# Patient Record
Sex: Female | Born: 2000
Health system: Southern US, Community
[De-identification: ages and names within clinical notes are randomized; demographics above are authoritative.]

## PROBLEM LIST (undated history)

## (undated) ENCOUNTER — Emergency Department (HOSPITAL_COMMUNITY): Admission: EM | Payer: Medicaid Other | Source: Home / Self Care

## (undated) DIAGNOSIS — F419 Anxiety disorder, unspecified: Secondary | ICD-10-CM

## (undated) DIAGNOSIS — F32A Depression, unspecified: Secondary | ICD-10-CM

## (undated) HISTORY — PX: NO PAST SURGERIES: SHX2092

## (undated) HISTORY — DX: Anxiety disorder, unspecified: F41.9

## (undated) HISTORY — DX: Depression, unspecified: F32.A

---
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
## 2022-01-27 DIAGNOSIS — Z419 Encounter for procedure for purposes other than remedying health state, unspecified: Secondary | ICD-10-CM | POA: Diagnosis not present

## 2022-02-27 DIAGNOSIS — Z419 Encounter for procedure for purposes other than remedying health state, unspecified: Secondary | ICD-10-CM | POA: Diagnosis not present

## 2022-03-29 DIAGNOSIS — Z419 Encounter for procedure for purposes other than remedying health state, unspecified: Secondary | ICD-10-CM | POA: Diagnosis not present

## 2022-04-29 DIAGNOSIS — Z419 Encounter for procedure for purposes other than remedying health state, unspecified: Secondary | ICD-10-CM | POA: Diagnosis not present

## 2022-05-01 ENCOUNTER — Ambulatory Visit
Admission: EM | Admit: 2022-05-01 | Discharge: 2022-05-01 | Disposition: A | Discharge status: Home / Self Care | Payer: Medicaid Other | Attending: Physician Assistant | Admitting: Physician Assistant

## 2022-05-01 ENCOUNTER — Emergency Department (HOSPITAL_COMMUNITY)
Admission: EM | Admit: 2022-05-01 | Discharge: 2022-05-02 | Disposition: A | Discharge status: Home / Self Care | Payer: Medicaid Other | Attending: Emergency Medicine | Admitting: Emergency Medicine

## 2022-05-01 ENCOUNTER — Other Ambulatory Visit: Payer: Self-pay

## 2022-05-01 DIAGNOSIS — I959 Hypotension, unspecified: Secondary | ICD-10-CM | POA: Diagnosis not present

## 2022-05-01 DIAGNOSIS — R109 Unspecified abdominal pain: Secondary | ICD-10-CM

## 2022-05-01 DIAGNOSIS — R1084 Generalized abdominal pain: Secondary | ICD-10-CM | POA: Diagnosis not present

## 2022-05-01 DIAGNOSIS — R112 Nausea with vomiting, unspecified: Secondary | ICD-10-CM

## 2022-05-01 DIAGNOSIS — R110 Nausea: Secondary | ICD-10-CM | POA: Diagnosis not present

## 2022-05-01 DIAGNOSIS — Z1152 Encounter for screening for COVID-19: Secondary | ICD-10-CM | POA: Insufficient documentation

## 2022-05-01 DIAGNOSIS — Z743 Need for continuous supervision: Secondary | ICD-10-CM | POA: Diagnosis not present

## 2022-05-01 LAB — URINALYSIS, ROUTINE W REFLEX MICROSCOPIC
Bilirubin Urine: NEGATIVE
Glucose, UA: NEGATIVE mg/dL
Ketones, ur: 5 mg/dL — AB
Leukocytes,Ua: NEGATIVE
Nitrite: NEGATIVE
Protein, ur: 100 mg/dL — AB
Specific Gravity, Urine: 1.021 (ref 1.005–1.030)
pH: 7 (ref 5.0–8.0)

## 2022-05-01 LAB — CBC
HCT: 38.7 % (ref 36.0–46.0)
Hemoglobin: 13.1 g/dL (ref 12.0–15.0)
MCH: 31.5 pg (ref 26.0–34.0)
MCHC: 33.9 g/dL (ref 30.0–36.0)
MCV: 93 fL (ref 80.0–100.0)
Platelets: 212 10*3/uL (ref 150–400)
RBC: 4.16 MIL/uL (ref 3.87–5.11)
RDW: 12.2 % (ref 11.5–15.5)
WBC: 9 10*3/uL (ref 4.0–10.5)
nRBC: 0 % (ref 0.0–0.2)

## 2022-05-01 LAB — COMPREHENSIVE METABOLIC PANEL
ALT: 11 U/L (ref 0–44)
AST: 23 U/L (ref 15–41)
Albumin: 4.7 g/dL (ref 3.5–5.0)
Alkaline Phosphatase: 60 U/L (ref 38–126)
Anion gap: 11 (ref 5–15)
BUN: 8 mg/dL (ref 6–20)
CO2: 22 mmol/L (ref 22–32)
Calcium: 9.5 mg/dL (ref 8.9–10.3)
Chloride: 105 mmol/L (ref 98–111)
Creatinine, Ser: 0.71 mg/dL (ref 0.44–1.00)
GFR, Estimated: >60 mL/min (ref >60)
Glucose, Bld: 123 mg/dL — ABNORMAL HIGH (ref 70–99)
Potassium: 3.5 mmol/L (ref 3.5–5.1)
Sodium: 138 mmol/L (ref 135–145)
Total Bilirubin: 0.8 mg/dL (ref 0.3–1.2)
Total Protein: 7.6 g/dL (ref 6.5–8.1)

## 2022-05-01 LAB — I-STAT BETA HCG BLOOD, ED (MC, WL, AP ONLY): I-stat hCG, quantitative: <5 m[IU]/mL (ref <5)

## 2022-05-01 LAB — LIPASE, BLOOD: Lipase: 30 U/L (ref 11–51)

## 2022-05-01 MED ORDER — PROMETHAZINE HCL 25 MG/ML IJ SOLN
12.5000 mg | Freq: Four times a day (QID) | INTRAMUSCULAR | Status: DC | PRN
  Filled 2022-05-01: qty 1

## 2022-05-01 MED ORDER — ONDANSETRON HCL 4 MG/2ML IJ SOLN: 4.0000 mg | Freq: Once | INTRAMUSCULAR | Status: DC

## 2022-05-01 NOTE — ED Triage Notes (Signed)
Pt arrived via EMS, from UC, c/o n/v abd pain

## 2022-05-01 NOTE — ED Triage Notes (Signed)
Pt c/o severe abd pain, nausea, vomiting, diarrhea, onset ~ last night.

## 2022-05-01 NOTE — ED Notes (Signed)
Patient is being discharged from the Urgent Care and sent to the Emergency Department via EMS . Per Shanon Brow, patient is in need of higher level of care due to not interactive in exam, vomiting. Patient is aware and verbalizes understanding of plan of care.  Vitals:   05/01/22 1222  BP: 110/63  Pulse: (!) 50  Resp: 18  Temp: (!) 97.3 F (36.3 C)  SpO2: 100%

## 2022-05-01 NOTE — Discharge Instructions (Addendum)
Advised transport to ER by EMS for higher level of care.

## 2022-05-01 NOTE — ED Provider Notes (Signed)
[facility]    CSN: [id number] Arrival date & time: 05/01/22  1153      History   Chief Complaint Chief Complaint  Patient presents with   Abdominal Pain    HPI Ashley Macias is a 22 y.o. female.   22 year old female presents with abdominal pain, nausea and vomiting.  Patient is actively throwing up in the exam room and is difficult to communicate with due to the amount of abdominal discomfort she is having and repeated vomiting.  Unable to give 4 mg of Zofran IM to control the abdominal pain and vomiting due to the severity of the patients symptoms.  It is difficult to evaluate the patient due to her not being able to follow the directions and it is difficult to get her to communicate due to her current level of discomfort.  Patient indicates that earlier this morning she started having abdominal pain, cramping, vomiting frequently with associated diarrhea and watery stools.  Indicates that the cramping is pain is so bad that she is curled up in the fetal position.  Patient denies having any fever, chills.  Patient indicates that before the symptoms started she ate cheese type products from [employer].  Patient denies being around any family or friends with similar symptoms. Patient indicates that her last period was at the beginning to the middle of December.  Patient is currently not on birth control, she is sexually active, and is not using protection.  Patient denies any urinary symptoms, fever or chills.   Abdominal Pain   Past Medical History:  Diagnosis Date   Anxiety    [employer] 12/27/2019    There are no problems to display for this patient.   History reviewed. No pertinent surgical history.  OB History     Gravida  0   Para  0   Term  0   Preterm  0   AB  0   Living  0      SAB  0   IAB  0   Ectopic  0   Multiple  0   Live Births  0        Obstetric Comments  Current 12th grade          Home Medications    Prior to  Admission medications   Medication Sig Start Date End Date Taking? Authorizing Provider  FLUoxetine (PROZAC) 10 MG capsule Take 1 capsule (10 mg total) by mouth daily. 09/26/21 12/26/21  Camillia Herter, NP  medroxyPROGESTERone Acetate 150 MG/ML SUSY Inject 1 mL (150 mg total) into the muscle every 3 (three) months. 10/04/20   Laury Deep, CNM  norethindrone-ethinyl estradiol (JUNEL FE 1/20) 1-20 MG-MCG tablet Take 1 tablet by mouth daily. 02/07/20 03/28/20  Laury Deep, CNM    Family History Family History  Problem Relation Age of Onset   Epilepsy Mother     Social History Social History   Tobacco Use   Smoking status: Former    Types: Cigars   Smokeless tobacco: Never  Scientific laboratory technician Use: Never used  Substance Use Topics   Alcohol use: Not Currently   Drug use: Not Currently    Types: Marijuana    Comment: last use 1-2 months ago     Allergies   Patient has no known allergies.   Review of Systems Review of Systems  Gastrointestinal:  Positive for abdominal pain (all over abdomen).     Physical Exam Triage Vital Signs ED Triage Vitals  Enc Vitals Group     BP 05/01/22 1222 110/63     Pulse Rate 05/01/22 1222 (!) 50     Resp 05/01/22 1222 18     Temp 05/01/22 1222 (!) 97.3 F (36.3 C)     Temp Source 05/01/22 1222 Oral     SpO2 05/01/22 1222 100 %     Weight --      Height --      Head Circumference --      Peak Flow --      Pain Score 05/01/22 1219 10     Pain Loc --      Pain Edu? --      Excl. in [place]? --    No data found.  Updated Vital Signs BP 110/63 (BP Location: Right Arm)   Pulse (!) 50   Temp (!) 97.3 F (36.3 C) (Oral)   Resp 18   SpO2 100%   Visual Acuity Right Eye Distance:   Left Eye Distance:   Bilateral Distance:    Right Eye Near:   Left Eye Near:    Bilateral Near:     Physical Exam Constitutional:      General: She is in acute distress (vomiting constantly).     Appearance: She is well-developed. She is  ill-appearing (not able to follow directions due to vomiting).  Cardiovascular:     Rate and Rhythm: Normal rate and regular rhythm.     Heart sounds: Normal heart sounds.  Pulmonary:     Effort: Pulmonary effort is normal.     Breath sounds: Normal breath sounds and air entry. No wheezing, rhonchi or rales.  Abdominal:     General: Abdomen is flat. Bowel sounds are decreased.     Palpations: Abdomen is rigid.     Tenderness: There is generalized abdominal tenderness.      UC Treatments / Results  Labs (all labs ordered are listed, but only abnormal results are displayed) Labs Reviewed - No data to display  EKG   Radiology No results found.  Procedures Procedures (including critical care time)  Medications Ordered in UC Medications  ondansetron (ZOFRAN) injection 4 mg (4 mg Intramuscular Not Given 05/01/22 1222)    Initial Impression / Assessment and Plan / UC Course  I have reviewed the triage vital signs and the nursing notes.  Pertinent labs & imaging results that were available during my care of the patient were reviewed by me and considered in my medical decision making (see chart for details).    Plan: 1.  The nausea and vomiting be treated with the following: A.  Unable to give Zofran 4 mg given IM due to the patient's symptoms. 2.  The abdominal pain will be treated with the following: A.  The patient will be transported to the emergency room by EMS for higher level of care due to the constant vomiting, abdominal cramping, and symptoms. 3.  Advised to return to urgent care as needed. Final Clinical Impressions(s) / UC Diagnoses   Final diagnoses:  Abdominal pain, unspecified abdominal location  Nausea and vomiting, unspecified vomiting type     Discharge Instructions      Advised transport to ER by EMS for higher level of care.    ED Prescriptions   None    PDMP not reviewed this encounter.   Nyoka Lint, PA-C 05/01/22 1251

## 2022-05-01 NOTE — ED Provider Triage Note (Signed)
Emergency Medicine Provider Triage Evaluation Note  Ashley Macias , a 22 y.o. female  was evaluated in triage.  Sent from urgent care due to severe abdominal pain.  She was unable to tolerate Zofran.  Says that she is having abdominal pain, nausea, vomiting, diarrhea, body aches, etc.  Review of Systems  Positive:  Negative:   Physical Exam  There were no vitals taken for this visit. Gen:   Awake, no distress   Resp:  Normal effort  MSK:   Moves extremities without difficulty  Other:  Generalized abdominal pain  Medical Decision Making  Medically screening exam initiated at 2:33 PM.  Appropriate orders placed.  Ashley Macias was informed that the remainder of the evaluation will be completed by another provider, this initial triage assessment does not replace that evaluation, and the importance of remaining in the ED until their evaluation is complete.  Patient will not participate in physical exam.  As far as I can tell her pain seems generalized   Rhae Hammock, PA-C 05/01/22 1434

## 2022-05-02 ENCOUNTER — Other Ambulatory Visit (HOSPITAL_COMMUNITY): Payer: Self-pay

## 2022-05-02 LAB — RESP PANEL BY RT-PCR (RSV, FLU A&B, COVID)  RVPGX2
Influenza A by PCR: NEGATIVE
Influenza B by PCR: NEGATIVE
Resp Syncytial Virus by PCR: NEGATIVE
SARS Coronavirus 2 by RT PCR: NEGATIVE

## 2022-05-02 MED ORDER — ONDANSETRON HCL 4 MG PO TABS
4.0000 mg | ORAL_TABLET | Freq: Four times a day (QID) | ORAL | 0 refills | Status: DC
  Filled 2022-05-02 – 2022-05-03 (×2): qty 12, 3d supply, fill #0

## 2022-05-02 MED ORDER — OMEPRAZOLE 20 MG PO CPDR
20.0000 mg | DELAYED_RELEASE_CAPSULE | Freq: Every day | ORAL | 0 refills | Status: DC
  Filled 2022-05-02: qty 30, 30d supply, fill #0

## 2022-05-02 NOTE — ED Provider Notes (Signed)
[facility] DEPT Provider Note   CSN: [id number] Arrival date & time: 05/01/22  1324     History  Chief Complaint  Patient presents with   Abdominal Pain    Ashley Macias is a 22 y.o. female.  HPI   Medical history including anxiety presents from urgent care for nausea vomiting abdominal pain.  Patient states symptoms started yesterday, she states that happened after she ate [employer] pizza and feels that the cheese was bad, she states pain was in the middle of abdomen, does not radiate, states it was constant but has now since resolved, she had multiple episodes of nausea and vomiting no coffee-ground emesis no bloody emesis, states that she is still passing gas and having no bowel movements had 1 today and there is no bloody stools, she does not  any urinary symptoms denies history of significant alcohol consumption or NSAID use she has no other medical history.  Home Medications Prior to Admission medications   Medication Sig Start Date End Date Taking? Authorizing Provider  omeprazole (PRILOSEC) 20 MG capsule Take 1 capsule (20 mg total) by mouth daily. 05/02/22 06/01/22 Yes Marcello Fennel, PA-C  ondansetron (ZOFRAN) 4 MG tablet Take 1 tablet (4 mg total) by mouth every 6 (six) hours. 05/02/22  Yes Marcello Fennel, PA-C  FLUoxetine (PROZAC) 10 MG capsule Take 1 capsule (10 mg total) by mouth daily. 09/26/21 12/26/21  Camillia Herter, NP  medroxyPROGESTERone Acetate 150 MG/ML SUSY Inject 1 mL (150 mg total) into the muscle every 3 (three) months. 10/04/20   Laury Deep, CNM  norethindrone-ethinyl estradiol (JUNEL FE 1/20) 1-20 MG-MCG tablet Take 1 tablet by mouth daily. 02/07/20 03/28/20  Laury Deep, CNM      Allergies    Patient has no known allergies.    Review of Systems   Review of Systems  Constitutional:  Negative for chills and fever.  Respiratory:  Negative for shortness of breath.   Cardiovascular:  Negative for chest pain.   Gastrointestinal:  Positive for abdominal pain, nausea and vomiting.  Neurological:  Negative for headaches.    Physical Exam Updated Vital Signs BP (!) 98/53   Pulse 66   Temp 98.9 F (37.2 C)   Resp 16   Ht _0  (1.575 m)   Wt 43 kg   SpO2 98%   BMI 17.34 kg/m  Physical Exam Vitals and nursing note reviewed.  Constitutional:      General: She is not in acute distress.    Appearance: She is not ill-appearing.  HENT:     Head: Normocephalic and atraumatic.     Nose: No congestion.  Eyes:     Conjunctiva/sclera: Conjunctivae normal.  Cardiovascular:     Rate and Rhythm: Normal rate and regular rhythm.     Pulses: Normal pulses.     Heart sounds: No murmur heard.    No friction rub. No gallop.  Pulmonary:     Effort: No respiratory distress.     Breath sounds: No wheezing, rhonchi or rales.  Abdominal:     Palpations: Abdomen is soft.     Tenderness: There is no abdominal tenderness. There is no right CVA tenderness or left CVA tenderness.     Comments: Abdomen is nondistended, soft, nontender during my examination, there is no guarding remittance or peritoneal sign negative [employer] point.  Skin:    General: Skin is warm and dry.  Neurological:     Mental Status: She is alert.  Psychiatric:        Mood and Affect: Mood normal.     ED Results / Procedures / Treatments   Labs (all labs ordered are listed, but only abnormal results are displayed) Labs Reviewed  COMPREHENSIVE METABOLIC PANEL - Abnormal; Notable for the following components:      Result Value   Glucose, Bld 123 (*)    All other components within normal limits  URINALYSIS, ROUTINE W REFLEX MICROSCOPIC - Abnormal; Notable for the following components:   Hgb urine dipstick LARGE (*)    Ketones, ur 5 (*)    Protein, ur 100 (*)    Bacteria, UA RARE (*)    All other components within normal limits  RESP PANEL BY RT-PCR (RSV, FLU A&B, COVID)  RVPGX2  LIPASE, BLOOD  CBC  I-STAT BETA HCG  BLOOD, ED ([facility], [facility], [facility] ONLY)    EKG None  Radiology No results found.  Procedures Procedures    Medications Ordered in ED Medications  promethazine (PHENERGAN) injection 12.5 mg (has no administration in time range)    ED Course/ Medical Decision Making/ A&P                           Medical Decision Making Risk Prescription drug management.   This patient presents to the ED for concern of abdominal pain, this involves an extensive number of treatment options, and is a complaint that carries with it a high risk of complications and morbidity.  The differential diagnosis includes bowel obstruction, gastritis, diverticulitis, pancreatitis    Additional history obtained:  Additional history obtained from N/A External records from outside source obtained and reviewed including urgent care notes   Co morbidities that complicate the patient evaluation  N/A  Social Determinants of Health:  N/A    Lab Tests:  I Ordered, and personally interpreted labs.  The pertinent results include: CBC is unremarkable, CMP shows glucose of 123, lipase 30, UA shows negative nitrates leukocytes there are some red blood cells white blood cells rare bacteria, respiratory panel negative, i-STAT hCG negative   Imaging Studies ordered:  I ordered imaging studies including N/A I independently visualized and interpreted imaging which showed N/A I agree with the radiologist interpretation   Cardiac Monitoring:  The patient was maintained on a cardiac monitor.  I personally viewed and interpreted the cardiac monitored which showed an underlying rhythm of: N/A   Medicines ordered and prescription drug management:  I ordered medication including N/A  I have reviewed the patients home medicines and have made adjustments as needed  Critical Interventions:  N/A   Reevaluation:  Obtain basic lab work which I personally reviewed they are unremarkable, she had a benign physical exam,  agreement discharge at this time.   Consultations Obtained:  N/a    Test Considered:  Ct ap-deferred as my suspicion under normal abnormality is low she has nonsurgical abdomen she is nontoxic-appearing vital signs are reassuring.  Patient is also agreement with this plan and will come back if symptoms worsening.    Rule out low suspicion for lower lobe pneumonia as lung sounds are clear bilaterally, will defer imaging at this time.  I have low suspicion for liver or gallbladder abnormality as she has no right upper quadrant tenderness, liver enzymes, alk phos, T bili all within normal limits.  Low suspicion for pancreatitis as lipase is within normal limits.  Low suspicion for ruptured stomach ulcer as she has no peritoneal sign  present on exam.  Low suspicion for bowel obstruction as abdomen is nondistended normal bowel sounds, so passing gas and having normal bowel movements.  Low suspicion for complicated diverticulitis as she is nontoxic-appearing, vital signs reassuring no leukocytosis present.  Low suspicion for appendicitis as she has no right lower quadrant tenderness, vital signs reassuring.  Suspicion for UTI [place] kidney stones also low she denies any urinary symptoms, that there is no evidence of infection present on UA, she does have noted blood present on her UA but she states she is on her menstrual cycle likely the cause of this.  I doubt ovarian torsion presentation atypical as she has no suprapubic pain no history of ovarian masses.  I doubt ectopic pregnancy as urine pregnancy is negative   Dispostion and problem list  After consideration of the diagnostic results and the patients response to treatment, I feel that the patent would benefit from discharge.   Abdominal pain-likely gastritis, sent home on Zofran, Prilosec, will recommend a bland diet, given strict return precautions.            Final Clinical Impression(s) / ED Diagnoses Final diagnoses:   Generalized abdominal pain    Rx / DC Orders ED Discharge Orders          Ordered    ondansetron (ZOFRAN) 4 MG tablet  Every 6 hours        05/02/22 0240    omeprazole (PRILOSEC) 20 MG capsule  Daily        05/02/22 0240              Marcello Fennel, PA-C 05/02/22 0321    Maudie Flakes, MD 05/02/22 [phone]

## 2022-05-02 NOTE — Discharge Instructions (Addendum)
Likely you have some inflammation from your stomach, recommend a bland diet, Zofran for nausea given you an acid pill as well.  Follow-up with PCP as needed   Come back to the emergency department if you develop chest pain, shortness of breath, severe abdominal pain, uncontrolled nausea, vomiting, diarrhea.

## 2022-05-03 ENCOUNTER — Other Ambulatory Visit (HOSPITAL_COMMUNITY): Payer: Self-pay

## 2022-05-30 DIAGNOSIS — Z419 Encounter for procedure for purposes other than remedying health state, unspecified: Secondary | ICD-10-CM | POA: Diagnosis not present

## 2022-06-28 DIAGNOSIS — Z419 Encounter for procedure for purposes other than remedying health state, unspecified: Secondary | ICD-10-CM | POA: Diagnosis not present

## 2022-07-29 DIAGNOSIS — Z419 Encounter for procedure for purposes other than remedying health state, unspecified: Secondary | ICD-10-CM | POA: Diagnosis not present

## 2022-08-20 ENCOUNTER — Other Ambulatory Visit: Payer: Self-pay

## 2022-08-20 MED ORDER — PENICILLIN V POTASSIUM 500 MG PO TABS
500.0000 mg | ORAL_TABLET | Freq: Four times a day (QID) | ORAL | 0 refills | Status: DC
  Filled 2022-08-20: qty 40, 10d supply, fill #0

## 2022-08-21 ENCOUNTER — Other Ambulatory Visit: Payer: Self-pay

## 2022-08-28 DIAGNOSIS — Z419 Encounter for procedure for purposes other than remedying health state, unspecified: Secondary | ICD-10-CM | POA: Diagnosis not present

## 2022-09-28 DIAGNOSIS — Z419 Encounter for procedure for purposes other than remedying health state, unspecified: Secondary | ICD-10-CM | POA: Diagnosis not present

## 2022-10-28 DIAGNOSIS — Z419 Encounter for procedure for purposes other than remedying health state, unspecified: Secondary | ICD-10-CM | POA: Diagnosis not present

## 2022-11-28 DIAGNOSIS — Z419 Encounter for procedure for purposes other than remedying health state, unspecified: Secondary | ICD-10-CM | POA: Diagnosis not present

## 2022-12-29 DIAGNOSIS — Z419 Encounter for procedure for purposes other than remedying health state, unspecified: Secondary | ICD-10-CM | POA: Diagnosis not present

## 2023-01-28 DIAGNOSIS — Z419 Encounter for procedure for purposes other than remedying health state, unspecified: Secondary | ICD-10-CM | POA: Diagnosis not present

## 2023-02-14 ENCOUNTER — Ambulatory Visit: Payer: Medicaid Other | Admitting: Family

## 2023-02-28 DIAGNOSIS — Z419 Encounter for procedure for purposes other than remedying health state, unspecified: Secondary | ICD-10-CM | POA: Diagnosis not present

## 2023-03-01 ENCOUNTER — Encounter (HOSPITAL_COMMUNITY): Payer: Self-pay

## 2023-03-01 ENCOUNTER — Ambulatory Visit (HOSPITAL_COMMUNITY)
Admission: EM | Admit: 2023-03-01 | Discharge: 2023-03-01 | Disposition: A | Discharge status: Home / Self Care | Payer: Medicaid Other | Attending: Emergency Medicine | Admitting: Emergency Medicine

## 2023-03-01 DIAGNOSIS — Z3201 Encounter for pregnancy test, result positive: Secondary | ICD-10-CM | POA: Diagnosis not present

## 2023-03-01 LAB — POCT URINE PREGNANCY: Preg Test, Ur: POSITIVE — AB

## 2023-03-01 NOTE — ED Triage Notes (Signed)
Patient is requesting a pregnancy test and states she does not remember when her last period was.

## 2023-03-01 NOTE — Discharge Instructions (Signed)
Call the women's clinic first thing Monday morning to get their soonest available appointment  [facility]  Any abdominal pain, cramping, or bleeding, go directly to the MAU (Entrance C of [place])

## 2023-03-01 NOTE — ED Provider Notes (Signed)
[facility]    CSN: [id number] Arrival date & time: 03/01/23  1444      History   Chief Complaint Chief Complaint  Patient presents with   Possible Pregnancy    HPI Ashley Macias is a 22 y.o. female.  Presents for pregnancy test She is unsure when her last menstrual cycle was. Definitely not in the last 2 months. She is sexually active. Does not currently use birth control or barrier protection. She used to receive depo injections  Not having pain, cramping, bleeding/spotting, or NV  Past Medical History:  Diagnosis Date   Anxiety    Phreesia 12/27/2019    There are no problems to display for this patient.   History reviewed. No pertinent surgical history.  OB History     Gravida  1   Para  0   Term  0   Preterm  0   AB  0   Living  0      SAB  0   IAB  0   Ectopic  0   Multiple  0   Live Births  0        Obstetric Comments  Current 12th grade          Home Medications    Prior to Admission medications   Medication Sig Start Date End Date Taking? Authorizing Provider  omeprazole (PRILOSEC) 20 MG capsule Take 1 capsule (20 mg total) by mouth daily. 05/02/22 06/01/22  Carroll Sage, PA-C  norethindrone-ethinyl estradiol (JUNEL FE 1/20) 1-20 MG-MCG tablet Take 1 tablet by mouth daily. 02/07/20 03/28/20  Raelyn Mora, CNM    Family History Family History  Problem Relation Age of Onset   Epilepsy Mother     Social History Social History   Tobacco Use   Smoking status: Former    Types: Cigars   Smokeless tobacco: Never  Vaping Use   Vaping status: Never Used  Substance Use Topics   Alcohol use: Not Currently   Drug use: Not Currently    Types: Marijuana    Comment: last use 1-2 months ago     Allergies   Patient has no known allergies.   Review of Systems Review of Systems  Pe rHPI  Physical Exam Triage Vital Signs ED Triage Vitals  Encounter Vitals Group     BP 03/01/23 1544 101/66      Systolic BP Percentile --      Diastolic BP Percentile --      Pulse Rate 03/01/23 1544 (!) 58     Resp 03/01/23 1544 16     Temp 03/01/23 1544 98.3 F (36.8 C)     Temp Source 03/01/23 1544 Oral     SpO2 03/01/23 1544 95 %     Weight --      Height --      Head Circumference --      Peak Flow --      Pain Score 03/01/23 1546 0     Pain Loc --      Pain Education --      Exclude from Growth Chart --    No data found.  Updated Vital Signs BP 101/66 (BP Location: Left Arm)   Pulse (!) 58   Temp 98.3 F (36.8 C) (Oral)   Resp 16   LMP  (LMP Unknown)   SpO2 95%    Physical Exam Vitals and nursing note reviewed.  Constitutional:      General: She is not  in acute distress.    Appearance: Normal appearance.  Cardiovascular:     Rate and Rhythm: Normal rate and regular rhythm.     Pulses: Normal pulses.     Heart sounds: Normal heart sounds.  Pulmonary:     Effort: Pulmonary effort is normal.     Breath sounds: Normal breath sounds.  Abdominal:     General: There is no distension.     Palpations: Abdomen is soft.     Tenderness: There is no abdominal tenderness. There is no guarding.  Neurological:     Mental Status: She is alert and oriented to person, place, and time.     UC Treatments / Results  Labs (all labs ordered are listed, but only abnormal results are displayed) Labs Reviewed  POCT URINE PREGNANCY - Abnormal; Notable for the following components:      Result Value   Preg Test, Ur Positive (*)    All other components within normal limits    EKG   Radiology No results found.  Procedures Procedures (including critical care time)  Medications Ordered in UC Medications - No data to display  Initial Impression / Assessment and Plan / UC Course  I have reviewed the triage vital signs and the nursing notes.  Pertinent labs & imaging results that were available during my care of the patient were reviewed by me and considered in my medical decision  making (see chart for details).  UPT is positive [facility]ided [facility] Cannot estimate how far along given unknown last cycle. She will call clinic first thing Monday. MAU precautions in the meantime.   Final Clinical Impressions(s) / UC Diagnoses   Final diagnoses:  Positive pregnancy test     Discharge Instructions      Call the women's clinic first thing Monday morning to get their soonest available appointment  [facility]  Any abdominal pain, cramping, or bleeding, go directly to the MAU (Entrance C of [place]; [facility])     ED Prescriptions   None    PDMP not reviewed this encounter.   Marlow Baars, [place] 03/01/23 1643

## 2023-03-03 ENCOUNTER — Other Ambulatory Visit: Payer: Self-pay

## 2023-03-03 ENCOUNTER — Other Ambulatory Visit: Payer: Self-pay | Admitting: Family Medicine

## 2023-03-03 ENCOUNTER — Other Ambulatory Visit (INDEPENDENT_AMBULATORY_CARE_PROVIDER_SITE_OTHER): Payer: Medicaid Other

## 2023-03-03 DIAGNOSIS — Z3A01 Less than 8 weeks gestation of pregnancy: Secondary | ICD-10-CM | POA: Diagnosis not present

## 2023-03-03 DIAGNOSIS — Z3491 Encounter for supervision of normal pregnancy, unspecified, first trimester: Secondary | ICD-10-CM

## 2023-03-03 NOTE — Progress Notes (Signed)
Established patient here today to schedule prenatal care. UPT positive at urgent care. RN to front desk to speak with patient about questions related to [place]. Pt states this was an unplanned pregnancy and she is considering abortion, but thinks she is too far along in pregnancy for this. Unsure of last LMP but thinks she may have conceived in July. Scheduled for dating [place] this AM. Reviewed with patient while in office that preliminary results show she is approx 7 weeks. Reviewed area abortion resources if needed. Pt will be contacted with final results and may schedule prenatal care if desired or follow up after abortion for birth control/gyn annual.

## 2023-03-17 ENCOUNTER — Telehealth: Payer: Medicaid Other

## 2023-03-17 DIAGNOSIS — Z8659 Personal history of other mental and behavioral disorders: Secondary | ICD-10-CM

## 2023-03-17 DIAGNOSIS — Z3490 Encounter for supervision of normal pregnancy, unspecified, unspecified trimester: Secondary | ICD-10-CM | POA: Diagnosis not present

## 2023-03-17 MED ORDER — BLOOD PRESSURE KIT DEVI: 1.0000 | Freq: Once | 0 refills | Status: AC

## 2023-03-17 NOTE — Progress Notes (Signed)
New OB Intake  I connected with Ashley Macias  on 03/17/23 at  1:15 PM EST by MyChart Video Visit and verified that I am speaking with the correct person using two identifiers. Nurse is located at [facility] and pt is located at home.  I discussed the limitations, risks, security and privacy concerns of performing an evaluation and management service by telephone and the availability of in person appointments. I also discussed with the patient that there may be a patient responsible charge related to this service. The patient expressed understanding and agreed to proceed.  I explained I am completing New OB Intake today. We discussed EDD of 10/17/23 based on [place] at [redacted]w[redacted]d. Pt is G1P0000. I reviewed her allergies, medications and Medical/Surgical/OB history.    Patient Active Problem List   Diagnosis Date Noted   Supervision of low-risk pregnancy 03/17/2023   Concerns addressed today History of depression & anxiety: Reports previously taking Prozac. She is unsure if she desires medication while pregnant. Would like to see [facility] for follow up. Front office notified to schedule. Tobacco & marijuana use during pregnancy: Current occasional use of both. Reviewed these are not recommended in pregnancy. Pt is currently decreasing tobacco use. Reports less cravings since beginning of pregnancy.   Delivery Plans Plans to deliver at [facility] Discussed the nature of our practice with multiple providers including residents and students. Due to the size of the practice, the delivering provider may not be the same as those providing prenatal care. Patient is unsure of interest in water birth.   MyChart/Babyscripts MyChart access verified. I explained pt will have some visits in office and some virtually. Babyscripts instructions given and order placed.   Blood Pressure Cuff/Weight Scale Blood pressure cuff ordered for patient to pick-up from [employer]. Explained after first prenatal appt pt will check  weekly and document in Babyscripts. Patient does have weight scale.  Anatomy US Explained first scheduled [place] will be around 19 weeks. Anatomy US scheduled for 05/23/23.  Is patient a CenteringPregnancy candidate?  Declined Declined due to Enrolled in [facility]   Is patient a Mom+Baby Combined Care candidate?  Accepted; visit type changed and Mom+Baby staff notified.  Is patient a candidate for Babyscripts Optimization? Yes-- but would prefer Mom+Baby Combined Care  First visit review I reviewed new OB appt with patient. Explained pt will be seen by Alvester Morin, MD at first visit. Discussed Avelina Laine genetic screening with patient; desires [facility] only.  Last Pap <16 years old  Marjo Bicker, RN 03/17/2023  1:32 PM

## 2023-03-18 DIAGNOSIS — Z3490 Encounter for supervision of normal pregnancy, unspecified, unspecified trimester: Secondary | ICD-10-CM | POA: Diagnosis not present

## 2023-03-24 ENCOUNTER — Encounter: Payer: Medicaid Other | Admitting: Family Medicine

## 2023-03-24 ENCOUNTER — Other Ambulatory Visit: Payer: Self-pay

## 2023-03-24 ENCOUNTER — Other Ambulatory Visit (HOSPITAL_COMMUNITY)
Admission: RE | Admit: 2023-03-24 | Discharge: 2023-03-24 | Disposition: A | Discharge status: Home / Self Care | Payer: Medicaid Other | Source: Ambulatory Visit | Attending: Family Medicine | Admitting: Family Medicine

## 2023-03-24 ENCOUNTER — Ambulatory Visit (INDEPENDENT_AMBULATORY_CARE_PROVIDER_SITE_OTHER): Payer: Medicaid Other | Admitting: Family Medicine

## 2023-03-24 ENCOUNTER — Encounter: Payer: Self-pay | Admitting: *Deleted

## 2023-03-24 VITALS — BP 112/76 | HR 71 | Wt 101.6 lb

## 2023-03-24 DIAGNOSIS — Z23 Encounter for immunization: Secondary | ICD-10-CM | POA: Diagnosis not present

## 2023-03-24 DIAGNOSIS — Z31430 Encounter of female for testing for genetic disease carrier status for procreative management: Secondary | ICD-10-CM

## 2023-03-24 DIAGNOSIS — Z1339 Encounter for screening examination for other mental health and behavioral disorders: Secondary | ICD-10-CM | POA: Diagnosis not present

## 2023-03-24 DIAGNOSIS — Z3481 Encounter for supervision of other normal pregnancy, first trimester: Secondary | ICD-10-CM | POA: Diagnosis not present

## 2023-03-24 DIAGNOSIS — Z3490 Encounter for supervision of normal pregnancy, unspecified, unspecified trimester: Secondary | ICD-10-CM

## 2023-03-24 MED ORDER — PRENATAL 27-1 MG PO TABS
1.0000 | ORAL_TABLET | Freq: Every day | ORAL | 0 refills | Status: AC
  Filled 2023-03-24: qty 30, 30d supply, fill #0

## 2023-03-24 MED ORDER — ASPIRIN 81 MG PO TBEC
81.0000 mg | DELAYED_RELEASE_TABLET | Freq: Every day | ORAL | 12 refills | Status: DC
  Filled 2023-03-24: qty 30, 30d supply, fill #0
  Filled 2023-04-16 (×3): qty 30, 30d supply, fill #1
  Filled 2023-05-18: qty 30, 30d supply, fill #2
  Filled 2023-06-19: qty 30, 30d supply, fill #3

## 2023-03-24 NOTE — Progress Notes (Signed)
Subjective:   Ashley Macias is a 22 y.o. G1P0000 at [redacted]w[redacted]d by LMP being seen today for her first obstetrical visit.  Her obstetrical history is significant for  none . Patient does not intend to breast feed. Pregnancy history fully reviewed.  Patient reports no bleeding, no contractions, no cramping, and no leaking.  HISTORY: OB History  Gravida Para Term Preterm AB Living  1 0 0 0 0 0  SAB IAB Ectopic Multiple Live Births  0 0 0 0 0    # Outcome Date GA Lbr Len/2nd Weight Sex Type Anes PTL Lv  1 Current             Obstetric Comments  Current 12th grade   Last pap smear was  NA  Past Medical History:  Diagnosis Date   Anxiety    Phreesia 12/27/2019   Depression    No past surgical history on file. Family History  Problem Relation Age of Onset   Epilepsy Mother    Social History   Tobacco Use   Smoking status: Some Days    Types: Cigars   Smokeless tobacco: Never   Tobacco comments:    Black and milds   Vaping Use   Vaping status: Never Used  Substance Use Topics   Alcohol use: Not Currently    Comment: occasional   Drug use: Yes    Types: Marijuana   No Known Allergies Current Outpatient Medications on File Prior to Visit  Medication Sig Dispense Refill   [DISCONTINUED] norethindrone-ethinyl estradiol (JUNEL FE 1/20) 1-20 MG-MCG tablet Take 1 tablet by mouth daily. 28 tablet 0   No current facility-administered medications on file prior to visit.     Exam   Vitals:   03/24/23 1000  BP: 112/76  Pulse: 71  Weight: 101 lb 9.6 oz (46.1 kg)   Fetal Heart Rate (bpm): 149  System: General: well-developed, well-nourished female in no acute distress   Skin: normal coloration and turgor, no rashes   Neurologic: oriented, normal, negative, normal mood   Extremities: normal strength, tone, and muscle mass, ROM of all joints is normal   HEENT PERRLA, extraocular movement intact and sclera clear, anicteric   Mouth/Teeth mucous membranes moist, pharynx  normal without lesions and dental hygiene good   Neck supple and no masses   Cardiovascular: regular rate and rhythm   Respiratory:  no respiratory distress, normal breath sounds   Abdomen: soft, non-tender; bowel sounds normal; no masses,  no organomegaly     Assessment:   Pregnancy: G1P0000 Patient Active Problem List   Diagnosis Date Noted   Supervision of low-risk pregnancy 03/17/2023     Plan:  1. Encounter for supervision of low-risk pregnancy, antepartum FHR and BP appropriate today Recommended initiating daily ASA at 12 weeks  Daily prenatal vitamin  - CBC/D/Plt+RPR+Rh+ABO+RubIgG... - GC/Chlamydia probe amp ([place])not at [facility] - Culture, OB Urine - Hemoglobin A1c  2. Encounter for supervision of other normal pregnancy in first trimester - [facility] PRENATAL TEST  3. Encounter of female for testing for genetic disease carrier status for procreative management - [facility] Basic Panel   Initial labs drawn. Continue prenatal vitamins. Genetic Screening discussed, NIPS: ordered. Ultrasound discussed; fetal anatomic survey: ordered. Problem list reviewed and updated. The nature of [facility] with multiple MDs and other Advanced Practice Providers was explained to patient; also emphasized that residents, students are part of our team. Routine obstetric precautions reviewed. No follow-ups  on file.

## 2023-03-25 ENCOUNTER — Telehealth: Payer: Self-pay | Admitting: Family Medicine

## 2023-03-25 LAB — CBC/D/PLT+RPR+RH+ABO+RUBIGG...
Antibody Screen: NEGATIVE
Basophils Absolute: 0 x10E3/uL (ref 0.0–0.2)
Basos: 1 %
EOS (ABSOLUTE): 0.1 x10E3/uL (ref 0.0–0.4)
Eos: 1 %
HCV Ab: NONREACTIVE
HIV Screen 4th Generation wRfx: NONREACTIVE
Hematocrit: 40.7 % (ref 34.0–46.6)
Hemoglobin: 13.2 g/dL (ref 11.1–15.9)
Hepatitis B Surface Ag: NEGATIVE
Immature Grans (Abs): 0 x10E3/uL (ref 0.0–0.1)
Immature Granulocytes: 0 %
Lymphocytes Absolute: 2 x10E3/uL (ref 0.7–3.1)
Lymphs: 41 %
MCH: 30.4 pg (ref 26.6–33.0)
MCHC: 32.4 g/dL (ref 31.5–35.7)
MCV: 94 fL (ref 79–97)
Monocytes Absolute: 0.4 x10E3/uL (ref 0.1–0.9)
Monocytes: 7 %
Neutrophils Absolute: 2.5 x10E3/uL (ref 1.4–7.0)
Neutrophils: 50 %
Platelets: 233 x10E3/uL (ref 150–450)
RBC: 4.34 x10E6/uL (ref 3.77–5.28)
RDW: 11 % — ABNORMAL LOW (ref 11.7–15.4)
RPR Ser Ql: NONREACTIVE
Rh Factor: POSITIVE
Rubella Antibodies, IGG: 15.4 {index} (ref >0.99)
WBC: 5 x10E3/uL (ref 3.4–10.8)

## 2023-03-25 LAB — GC/CHLAMYDIA PROBE AMP (~~LOC~~) NOT AT ARMC
Chlamydia: POSITIVE — AB
Comment: NEGATIVE
Comment: NORMAL
Neisseria Gonorrhea: POSITIVE — AB

## 2023-03-25 LAB — HCV INTERPRETATION

## 2023-03-25 LAB — HEMOGLOBIN A1C
Est. average glucose Bld gHb Est-mCnc: 105 mg/dL
Hgb A1c MFr Bld: 5.3 % (ref 4.8–5.6)

## 2023-03-25 NOTE — Telephone Encounter (Signed)
Patient scheduled for treatment of Gonorrhea and Chlamydia on 11/27 at 2pm. Judeth Cornfield, [facility]

## 2023-03-25 NOTE — Telephone Encounter (Signed)
Called patient to discuss results from wet prep.  She is positive for gonorrhea and chlamydia.  Nurse visit will be scheduled to have patient treated.  Discussed that she should not be sexually active and that her partner should be notified as well.  She is agreeable.  No further questions or concerns.

## 2023-03-26 ENCOUNTER — Other Ambulatory Visit: Payer: Self-pay

## 2023-03-26 ENCOUNTER — Ambulatory Visit: Payer: Medicaid Other

## 2023-03-26 VITALS — BP 97/58 | HR 83 | Wt 99.0 lb

## 2023-03-26 DIAGNOSIS — O98211 Gonorrhea complicating pregnancy, first trimester: Secondary | ICD-10-CM

## 2023-03-26 DIAGNOSIS — Z3A10 10 weeks gestation of pregnancy: Secondary | ICD-10-CM

## 2023-03-26 DIAGNOSIS — O98811 Other maternal infectious and parasitic diseases complicating pregnancy, first trimester: Secondary | ICD-10-CM

## 2023-03-26 DIAGNOSIS — A749 Chlamydial infection, unspecified: Secondary | ICD-10-CM | POA: Diagnosis not present

## 2023-03-26 DIAGNOSIS — R4589 Other symptoms and signs involving emotional state: Secondary | ICD-10-CM

## 2023-03-26 DIAGNOSIS — F69 Unspecified disorder of adult personality and behavior: Secondary | ICD-10-CM

## 2023-03-26 MED ORDER — CEFTRIAXONE SODIUM 500 MG IJ SOLR
500.0000 mg | Freq: Once | INTRAMUSCULAR | Status: AC
  Administered 2023-03-26: 500 mg via INTRAMUSCULAR

## 2023-03-26 MED ORDER — AZITHROMYCIN 500 MG PO TABS
1000.0000 mg | ORAL_TABLET | Freq: Once | ORAL | Status: AC
  Administered 2023-03-26: 1000 mg via ORAL

## 2023-03-26 NOTE — Progress Notes (Cosign Needed Addendum)
Patient here for treatment for positive results of gonorrhea and chlamydia on 03/24/23.   Patient reports some green/white discharge and vaginal itchiness.  Administered azithromycin 1,000 mg PO and Rocephin 500mg  IM without complications.  Advised patient to abstain from sexual activity for at least 2 weeks and will TOC in 3-4 weeks. Patient verbalized understanding.  Notified patient if she has any questions or concerns to reach out to our office.    Marcelino Duster, RN

## 2023-03-27 LAB — CULTURE, OB URINE

## 2023-03-27 LAB — URINE CULTURE, OB REFLEX: Organism ID, Bacteria: NO GROWTH

## 2023-03-30 DIAGNOSIS — Z419 Encounter for procedure for purposes other than remedying health state, unspecified: Secondary | ICD-10-CM | POA: Diagnosis not present

## 2023-03-30 LAB — PANORAMA PRENATAL TEST FULL PANEL:PANORAMA TEST PLUS 5 ADDITIONAL MICRODELETIONS: FETAL FRACTION: 7.2

## 2023-04-02 ENCOUNTER — Telehealth: Payer: Self-pay

## 2023-04-02 NOTE — Telephone Encounter (Signed)
Pt tested + for GC/Ch.  Left message that I am calling with results and f/u.  Please give the office a call back.   Edder Bellanca,RN  04/02/23

## 2023-04-03 ENCOUNTER — Telehealth: Payer: Self-pay | Admitting: Family Medicine

## 2023-04-03 LAB — HORIZON CUSTOM: REPORT SUMMARY: POSITIVE — AB
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
## 2023-04-03 NOTE — Telephone Encounter (Signed)
Pt left message on nurse voicemail today @ 2:39 pm.  She stated that she is returning a call regarding test results.

## 2023-04-07 NOTE — BH Specialist Note (Unsigned)
Integrated Behavioral Health via Telemedicine Visit  04/07/2023 OREAN DARIS [id number]  Number of Integrated Behavioral Health Clinician visits: No data recorded Session Start time: No data recorded  Session End time: No data recorded Total time in minutes: No data recorded  Referring Provider: Jaynie Collins, MD Patient/Family location: Home*** [facility] Provider location: [facility]  All persons participating in visit: Patient Anabelle Freiermuth and [facility] Kiley Solimine ***  Types of Service: {CHL AMB TYPE OF [phone]}  I connected with Trevor Iha and/or Kita O Devito's {family members:20773} via  Telephone or Video Enabled Telemedicine Application  (Video is Caregility application) and verified that I am speaking with the correct person using two identifiers. Discussed confidentiality: Yes   I discussed the limitations of telemedicine and the availability of in person appointments.  Discussed there is a possibility of technology failure and discussed alternative modes of communication if that failure occurs.  I discussed that engaging in this telemedicine visit, they consent to the provision of behavioral healthcare and the services will be billed under their insurance.  Patient and/or legal guardian expressed understanding and consented to Telemedicine visit: Yes   Presenting Concerns: Patient and/or family reports the following symptoms/concerns: *** Duration of problem: ***; Severity of problem: {Mild/Moderate/Severe:20260}  Patient and/or Family's Strengths/Protective Factors: {CHL AMB BH PROTECTIVE FACTORS:4046643976}  Goals Addressed: Patient will:  Reduce symptoms of: {IBH Symptoms:21014056}   Increase knowledge and/or ability of: {IBH Patient Tools:21014057}   Demonstrate ability to: {IBH Goals:21014053}  Progress towards Goals: {CHL AMB BH PROGRESS TOWARDS GOALS:[phone]}  Interventions: Interventions  utilized:  {IBH Interventions:21014054} Standardized Assessments completed: {IBH Screening Tools:21014051}  Patient and/or Family Response: Patient agrees with treatment plan.   Assessment: Patient currently experiencing ***.   Patient may benefit from psychoeducation and brief therapeutic interventions regarding coping with symptoms of *** .  Plan: Follow up with behavioral health clinician on : *** Behavioral recommendations:  -*** -*** Referral(s): {IBH Referrals:21014055}  I discussed the assessment and treatment plan with the patient and/or parent/guardian. They were provided an opportunity to ask questions and all were answered. They agreed with the plan and demonstrated an understanding of the instructions.   They were advised to call back or seek an in-person evaluation if the symptoms worsen or if the condition fails to improve as anticipated.  Rae Lips, LCSW     03/24/2023   10:48 AM 05/02/2021   10:27 AM 03/28/2020    4:10 PM 03/07/2020    5:08 AM 02/15/2020    1:26 AM  Depression screen PHQ 2/9  Decreased Interest 1 1 1 1 2   Down, Depressed, Hopeless 1 0 0 0 2  PHQ - 2 Score 2 1 1 1 4   Altered sleeping 2  0 0 3  Tired, decreased energy 2 1 1 1 3   Change in appetite 2 0 0 0 2  Feeling bad or failure about yourself  0 0 0 0 1  Trouble concentrating 0 1 1 1  0  Moving slowly or fidgety/restless 2 0 0 0 0  Suicidal thoughts 0 0 0 0 1  PHQ-9 Score 10  3 3 14   Difficult doing work/chores Not difficult at all Somewhat difficult         03/24/2023   10:48 AM 03/28/2020    4:10 PM 03/07/2020    5:07 AM 02/15/2020    1:27 AM  GAD 7 : Generalized Anxiety Score  Nervous, Anxious, on Edge 0 0  1 3  Control/stop worrying 0 0 0 2  Worry too much - different things 0 0 0 3  Trouble relaxing 0 1 0 3  Restless 0 0 0 1  Easily annoyed or irritable 3 0 0 3  Afraid - awful might happen 1 0 0 3  Total GAD 7 Score 4 1 1 18   Anxiety Difficulty Not difficult at all

## 2023-04-07 NOTE — Telephone Encounter (Signed)
Called pt and pt stated that she was already treated.  I informed pt that she will get retested in 3-4 weeks.  Pt verbalized understanding.   Azaela Caracci,RN  04/07/23

## 2023-04-16 ENCOUNTER — Other Ambulatory Visit: Payer: Self-pay

## 2023-04-16 ENCOUNTER — Telehealth: Payer: Self-pay

## 2023-04-16 ENCOUNTER — Other Ambulatory Visit: Payer: Self-pay | Admitting: Family Medicine

## 2023-04-16 ENCOUNTER — Other Ambulatory Visit (HOSPITAL_COMMUNITY): Payer: Self-pay

## 2023-04-16 DIAGNOSIS — Z3490 Encounter for supervision of normal pregnancy, unspecified, unspecified trimester: Secondary | ICD-10-CM

## 2023-04-16 DIAGNOSIS — K5900 Constipation, unspecified: Secondary | ICD-10-CM

## 2023-04-16 MED ORDER — DOCUSATE SODIUM 100 MG PO CAPS
100.0000 mg | ORAL_CAPSULE | Freq: Two times a day (BID) | ORAL | 0 refills | Status: DC
  Filled 2023-04-16 (×2): qty 10, 5d supply, fill #0

## 2023-04-16 MED ORDER — PREPLUS 27-1 MG PO TABS
1.0000 | ORAL_TABLET | Freq: Every day | ORAL | 11 refills | Status: DC
  Filled 2023-04-16 (×2): qty 30, 30d supply, fill #0
  Filled 2023-05-18 – 2023-05-19 (×2): qty 30, 30d supply, fill #1
  Filled 2023-06-19: qty 30, 30d supply, fill #2

## 2023-04-16 NOTE — Telephone Encounter (Signed)
Patient called into office and originally spoke with front desk staff--transferred to RN on calls. Verified patient with name and DOB. Patient reports constipation--not to where patient isnt having BM daily, but where the BM is harder to pass--recently. Patient had BM this morning and described it as "little balls" and reported straining while passing BM. When patient looked in toilet, she noted blood--described it as "regula red" blood; patient states that she wiped only her vagina and did not see blood on tissue, then she wiped only her rectum and stated that she did not see blood on tissue. Patient reports that she is not having any pain and still feels baby movement. Patient also reports intercourse about 1-2 hours prior to BM.   Discussed with Albertine Grates FNP--per provider, RN to send in Rx for colace. RN to advise patient not abstain from sex until we confirm that bleeding is from constipation. Also reviewed precautions for when to go to MAU--bleeding similar to or heavier than a period and/or pain (especially one sided). Patient verbalized understanding, and requested refill for prenatals to be sent in along with colace. Verified pharmacy with patient to be [facility].   Patient had no other questions or concerns. Rx's sent in.   Maureen Ralphs RN on 04/16/23 at [phone]

## 2023-04-17 ENCOUNTER — Other Ambulatory Visit: Payer: Self-pay

## 2023-04-21 ENCOUNTER — Ambulatory Visit: Payer: Medicaid Other | Admitting: Clinical

## 2023-04-21 DIAGNOSIS — F4323 Adjustment disorder with mixed anxiety and depressed mood: Secondary | ICD-10-CM

## 2023-04-21 DIAGNOSIS — Z658 Other specified problems related to psychosocial circumstances: Secondary | ICD-10-CM

## 2023-04-21 NOTE — Patient Instructions (Addendum)
Center for [facility] at [facility] for Women [address] [place], [place] [address] [phone] (main office) [phone] [facility] office)  www.conehealthybaby.com  Family Paws: Creating Dog Aware Generations familypaws.com   Housing Resources                    [employer] (serves [place], [place], [place], [place], [place], [place], [place], [place], [place], [place], [place], [place], and [place] counties) [address] [place], [place] [address] [phone] DeveloperU.ch  **Rental assistance, Home Rehabilitation,Weatherization Assistance Program, Chief Financial Officer, Housing Voucher Program   Housing Resources [facility]  [facility] [place] [address], [place], [place] [address] [phone] www.gha-Souris.[facility] [address] Tawny Hopping [place], [place] [address] [phone] PhoneCaptions.ch **Programs include: Hospital doctor and Housing Counseling, Healthy Radiographer, therapeutic, Homeless Prevention and Housing Assistance  Government [facility] [address], [place], [place] [address] [phone] www.PaintingEmporium.co.za **housing applications/recertification; tax payment relief/exemption under specific qualifications  [facility] [address], [place], [place] [address] www.online[place].com/~maryshouse **transitional housing for women in recovery who have minor children or are pregnant  [facility] [address] [place], [place], [place] [address] https://johnson-smith.net/  **emergency shelter and support services for families facing homelessness  [employer] [address], [place], [place] [address] [phone] www.youthfocus.org **transitional housing to pregnant women; emergency housing for youth who have run away, are experiencing a family crisis, are victims of abuse or neglect,  or are homeless  [facility] [address], [place], [place] [address] [phone] ircgso.org **Drop-in center for people experiencing homelessness; overnight warming center when temperature is 25 degrees or below  [employer] [address] [place], [place], [place] [address] [phone] https://reentrystaffingagency.org/ **help with affordable housing to people experiencing homelessness or unemployment due to incarceration  [facility] [address], [place], [place] [address] [phone] www.greensborourbanministry.org  **emergency and transitional housing, rent/mortgage assistance, utility assistance  [facility] [address], [place], [place] [address] [phone] www.salvationarmyofgreensboro.org **emergency and transitional housing  [employer] for [employer] [address], [place], [place] [address] [phone] Www.habitatgreensboro.[facility]   [employer] [address], [place], [place] [address] [phone] https://chshousing.org **Home Ownership/Affordable Housing Program and [facility]  [employer] [address], [place], [place] [address] [phone] PaidValue.com.cy **home buyer education courses, foreclosure prevention  [facility] [address], [place], [place] [address] [phone] DefMagazine.is **Environmental Exposure Assessment (investigation of homes where either children or pregnant women with a confirmed elevated blood lead level reside)  [facility] [address] [place] Math [place], [place] [address] [phone] ShowReturn.ca **Home Expense Assistance/Repairs Program; offers home accessibility updates, such as ramps or bars in the bathroom  Self-Help  [employer] [address], [place], [place] [address] [phone] https://www.self-help.org/locations/[place]-branch **Offers credit-building and banking services to people unable to use traditional banking  Transportation Resources [employer] ([employer]) [address] [place] [place], [place], [place] [address] https://www.[place].gov/departments/transportation/gdot-divisions/[place]-transit-agency-public-transportation-division     Fixed-route bus services, including regional fare cards for PART, [place], [place], and WSTA buses.  Reduced fare bus ID's available for Medicaid, Medicare, and "orange card" recipients.  SCAT offers curb-to-curb and door-to-door bus services for people with disabilities who are unable to use a fixed-bus route; also offers a shared-ride program.   Helpful tips:  -Routes available online and physical maps  available at the main bus hub lobby (each for a specific route) -Smartphone directions often include bus routes (see the "bus" icon, next to the "car" and "walk" icons) -Routes differ on weekends, evenings and holidays, so plan ahead!  -If you have Medicaid, Medicare, or orange card, plan to obtain a reduced-fare ID to save 50% on rides. Check days and times to obtain an ID, and bring all necessary documents.   [employer] [address], [place], [place] [address] [phone] www.wheels4hope.org **REFERRAL NEEDED by specific agencies (see website), after meeting specified criteria only  Estate manager/land agent  (Childcare options, Early childcare development, etc.) DietDisorder.cz  [employer] Child Care Facility Search Engine  https://ncchildcare.http://cook.com/

## 2023-04-24 ENCOUNTER — Encounter: Payer: Medicaid Other | Admitting: Family Medicine

## 2023-04-28 ENCOUNTER — Other Ambulatory Visit: Payer: Self-pay

## 2023-04-28 NOTE — BH Specialist Note (Signed)
 Integrated Behavioral Health via Telemedicine Visit  05/12/2023 Ashley Macias [id number]  Number of Integrated Behavioral Health Clinician visits: 2- Second Visit  Session Start time: 0950   Session End time: 1010  Total time in minutes: 20  Referring Provider: Gloris Hugger, MD Patient/Family location: Home [facility] Provider location: Center for [facility] at [facility] for Women  All persons participating in visit: Patient Ashley Macias and [facility] Ashley Macias   Types of Service: Individual psychotherapy and Telephone visit  I connected with Ashley Macias and/or Ashley Macias's  n/a  via  Telephone or Video Enabled Telemedicine Application  (Video is Caregility application) and verified that I am speaking with the correct person using two identifiers. Discussed confidentiality: Yes   I discussed the limitations of telemedicine and the availability of in person appointments.  Discussed there is a possibility of technology failure and discussed alternative modes of communication if that failure occurs.  I discussed that engaging in this telemedicine visit, they consent to the provision of behavioral healthcare and the services will be billed under their insurance.  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 Pt's next goals are to save up for a car and preparing for baby's arrival.  Duration of problem: Ongoing current pregnancy; Severity of problem: mild  Patient and/or Family's Strengths/Protective Factors: Social connections, Concrete supports in place (healthy food, safe environments, etc.), Sense  of purpose, and Physical Health (exercise, healthy diet, medication compliance, etc.)  Goals Addressed: Patient will:  Reduce symptoms of: anxiety, depression, and stress   Increase knowledge and/or ability of: stress reduction   Demonstrate ability to: Increase healthy adjustment to current life circumstances and Increase motivation to adhere to plan of care  Progress towards Goals: Ongoing  Interventions: Interventions utilized:  Motivational Interviewing, Link to [employer], and Supportive Reflection Standardized Assessments completed: GAD-7 and PHQ 9  Patient and/or Family Response: Patient agrees with treatment plan.   Assessment: Patient currently experiencing Adjustment disorder with mixed anxious and depressed mood; Psychosocial stress.   Patient may benefit from continued therapeutic intervention  .  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 They were provided an opportunity to ask questions and all were answered. They agreed with the plan and demonstrated an understanding of the instructions.   They were advised to call back or seek an in-person evaluation if the symptoms worsen or if the condition fails to improve as anticipated.  Warren JAYSON Mering, LCSW     05/12/2023   10:00 AM 03/24/2023   10:48 AM 05/02/2021   10:27 AM 03/28/2020    4:10 PM 03/07/2020    5:08 AM  Depression screen PHQ 2/9  Decreased Interest 0 1 1 1 1   Down, Depressed, Hopeless 0 1 0 0 0  PHQ - 2 Score 0 2 1 1 1   Altered sleeping 0 2  0 0  Tired, decreased energy 0 2 1 1 1   Change in appetite 0 2 0 0 0  Feeling bad or failure about yourself  0 0 0 0 0  Trouble concentrating 0 0 1 1 1   Moving slowly or fidgety/restless 0 2 0 0 0  Suicidal thoughts 0 0 0 0 0  PHQ-9 Score 0 10  3 3   Difficult doing work/chores  Not difficult at all Somewhat difficult        05/12/2023   10:02 AM 03/24/2023   10:48 AM 03/28/2020    4:10 PM 03/07/2020    5:07 AM  GAD 7 : Generalized Anxiety Score  Nervous, Anxious, on Edge 0 0 0 1  Control/stop worrying 0 0 0 0  Worry too much - different things 0 0 0 0  Trouble relaxing 0 0 1 0  Restless 0 0 0 0  Easily annoyed or irritable 0 3 0 0  Afraid - awful might happen 0 1 0 0  Total GAD 7 Score 0 4 1 1   Anxiety Difficulty  Not difficult at all

## 2023-04-30 DIAGNOSIS — Z419 Encounter for procedure for purposes other than remedying health state, unspecified: Secondary | ICD-10-CM | POA: Diagnosis not present

## 2023-04-30 NOTE — L&D Delivery Note (Signed)
 OB/GYN Faculty Practice Delivery Note  Ashley Macias is a 23 y.o. G1P0000 s/p SVD at [redacted]w[redacted]d. She was admitted for latent labor.   ROM: 0h 30m with clear fluid GBS Status: neg Maximum Maternal Temperature: 98.6  Labor Progress: [name] was admitted in latent labor; she progressed spontaneously to complete and had SROM while pushing, which was less than 30 mins total.  Delivery Date/Time: June 13th, 2025 at 2059 Delivery: Called to room and patient was complete. She began pushing which was <30 mins; head delivered LOA. Nuchal cord present x 1; reduced after delivery. Shoulder and body delivered in usual fashion. Infant with spontaneous cry, placed on mother's abdomen, dried and stimulated. Cord clamped x 2 after 1-minute delay, and cut by mother and aunt of patient. Cord blood drawn. Placenta delivered spontaneously with gentle cord traction. Fundus firm with massage and Pitocin. Labia, perineum, vagina, and cervix inspected and found to be intact.   Placenta: spont, intact; to L&D Complications: none Lacerations: none EBL: 90cc Analgesia: epidural  Postpartum Planning [x]  message to sent to schedule follow-up   Infant: girl  APGARs 7/9  2930g (6lb 7.4oz)  Jolayne Natter, CNM  10/10/2023 9:15 PM

## 2023-05-01 ENCOUNTER — Encounter: Payer: Self-pay | Admitting: *Deleted

## 2023-05-01 ENCOUNTER — Ambulatory Visit: Payer: Medicaid Other | Admitting: Family Medicine

## 2023-05-01 ENCOUNTER — Other Ambulatory Visit (HOSPITAL_COMMUNITY)
Admission: RE | Admit: 2023-05-01 | Discharge: 2023-05-01 | Disposition: A | Discharge status: Home / Self Care | Payer: Medicaid Other | Source: Ambulatory Visit | Attending: Family Medicine | Admitting: Family Medicine

## 2023-05-01 ENCOUNTER — Other Ambulatory Visit: Payer: Self-pay

## 2023-05-01 VITALS — BP 112/72 | HR 86 | Wt 106.4 lb

## 2023-05-01 DIAGNOSIS — F32A Depression, unspecified: Secondary | ICD-10-CM

## 2023-05-01 DIAGNOSIS — O99322 Drug use complicating pregnancy, second trimester: Secondary | ICD-10-CM

## 2023-05-01 DIAGNOSIS — Z3A15 15 weeks gestation of pregnancy: Secondary | ICD-10-CM

## 2023-05-01 DIAGNOSIS — O285 Abnormal chromosomal and genetic finding on antenatal screening of mother: Secondary | ICD-10-CM

## 2023-05-01 DIAGNOSIS — F419 Anxiety disorder, unspecified: Secondary | ICD-10-CM

## 2023-05-01 DIAGNOSIS — Z3490 Encounter for supervision of normal pregnancy, unspecified, unspecified trimester: Secondary | ICD-10-CM

## 2023-05-01 NOTE — Progress Notes (Signed)
   PRENATAL VISIT NOTE  Subjective:  Ashley Macias is a 23 y.o. G1P0000 at [redacted]w[redacted]d being seen today for ongoing prenatal care.  She is currently monitored for the following issues for this low-risk pregnancy and has Supervision of low-risk pregnancy on their problem list.  Patient reports no bleeding, no contractions, no cramping, and no leaking.  Contractions: Not present. Vag. Bleeding: None.  Movement: Absent. Denies leaking of fluid.   The following portions of the patient's history were reviewed and updated as appropriate: allergies, current medications, past family history, past medical history, past social history, past surgical history and problem list.   Objective:   Vitals:   05/01/23 1107  BP: 112/72  Pulse: 86  Weight: 106 lb 7 oz (48.3 kg)    Fetal Status: Fetal Heart Rate (bpm): 154   Movement: Absent     General:  Alert, oriented and cooperative. Patient is in no acute distress.  Skin: Skin is warm and dry. No rash noted.   Cardiovascular: Normal heart rate noted  Respiratory: Normal respiratory effort, no problems with respiration noted  Abdomen: Soft, gravid, appropriate for gestational age.  Pain/Pressure: Absent     Pelvic: Cervical exam deferred        Extremities: Normal range of motion.  Edema: None  Mental Status: Normal mood and affect. Normal behavior. Normal judgment and thought content.   Assessment and Plan:  Pregnancy: G1P0000 at [redacted]w[redacted]d 1. Encounter for supervision of low-risk pregnancy, antepartum (Primary) FHR and BP appropriate today Patient with positive GC and chlamydia test at previous visit.  TOC today.  2. Anxiety and depression Currently following with behavioral health.  Doing well and will continue those visits  3. Abnormal genetic test during pregnancy Carrier for beta hemoglobinopathies and sickle cell.  Discussed partner testing but the partner is not in the relationship.  She is considering reaching out to him for the testing but will  let us  know if further visits.  Preterm labor symptoms and general obstetric precautions including but not limited to vaginal bleeding, contractions, leaking of fluid and fetal movement were reviewed in detail with the patient. Please refer to After Visit Summary for other counseling recommendations.   No follow-ups on file.  Future Appointments  Date Time Provider Department Center  05/12/2023  9:45 AM [facility] HEALTH CLINICIAN [facility]  05/23/2023  1:15 PM [facility] NURSE [facility]  05/23/2023  1:30 PM [facility] US3 [facility]  05/30/2023  8:55 AM Lola, Donnice HERO, MD [facility]    Norleen LULLA Rover, MD

## 2023-05-02 LAB — CERVICOVAGINAL ANCILLARY ONLY
Chlamydia: NEGATIVE
Comment: NEGATIVE
Comment: NORMAL
Neisseria Gonorrhea: NEGATIVE

## 2023-05-07 ENCOUNTER — Telehealth: Payer: Self-pay | Admitting: Family Medicine

## 2023-05-07 NOTE — Telephone Encounter (Signed)
 Patient called saying that she is 16 weeks and has had a bad headache for the past two days. I informed patient to take Excedrin Tension Headache and drink plenty of fluids per Dr.Cresenzo and told her that if her headache does not get any better she should call back so that she can be seen before her next appointment on 05/30/23. Patient understands and will call back if headache worsens.

## 2023-05-12 ENCOUNTER — Ambulatory Visit: Payer: Medicaid Other

## 2023-05-12 DIAGNOSIS — F4323 Adjustment disorder with mixed anxiety and depressed mood: Secondary | ICD-10-CM

## 2023-05-12 DIAGNOSIS — Z658 Other specified problems related to psychosocial circumstances: Secondary | ICD-10-CM

## 2023-05-12 NOTE — Patient Instructions (Addendum)
[facility] [address] [place], [place] [address] [phone] (main office) [phone] (Eknoor Novack's office)     BRAINSTORMING  Develop a Plan Goals: Provide a way to start conversation about your new life with a baby Assist parents in recognizing and using resources within their reach Help pave the way before birth for an easier period of transition afterwards.  Make a list of the following information to keep in a central location: Full name of Mom and Partner: _____________________________________________ Baby's full name and Date of Birth: ___________________________________________ Home Address: ___________________________________________________________ ________________________________________________________________________ Home Phone: ____________________________________________________________ Parents' cell numbers: _____________________________________________________ ________________________________________________________________________ Name and contact info for OB: ______________________________________________ Name and contact info for Pediatrician:________________________________________ Contact info for Lactation Consultants: ________________________________________  REST and SLEEP *You each need at least 4-5 hours of uninterrupted sleep every day. Write specific names and contact information.* How are you going to rest in the postpartum period? While partner's home? When partner returns to work? When you both return to work? Where will your baby sleep? Who is available to help during the day? Evening? Night? Who could move in for a period to help support you? What are some ideas to help you get enough  sleep? __________________________________________________________________________________________________________________________________________________________________________________________________________________________________________ NUTRITIOUS FOOD AND DRINK *Plan for meals before your baby is born so you can have healthy food to eat during the immediate postpartum period.* Who will look after breakfast? Lunch? Dinner? List names and contact information. Brainstorm quick, healthy ideas for each meal. What can you do before baby is born to prepare meals for the postpartum period? How can others help you with meals? Which grocery stores provide online shopping and delivery? Which restaurants offer take-out or delivery options? ______________________________________________________________________________________________________________________________________________________________________________________________________________________________________________________________________________________________________________________________________________________________________________________________________  CARE FOR MOM *It's important that mom is cared for and pampered in the postpartum period. Remember, the most important ways new mothers need care are: sleep, nutrition, gentle exercise, and time off.* Who can come take care of mom during this period? Make a list of people with their contact information. List some activities that make you feel cared for, rested, and energized? Who can make sure you have opportunities to do these things? Does mom have a space of her very own within your home that's just for her? Make a "Mama Cave" where she can be comfortable, rest, and renew herself  daily. ______________________________________________________________________________________________________________________________________________________________________________________________________________________________________________________________________________________________________________________________________________________________________________________________________    CARE FOR AND FEEDING BABY *Knowledgeable and encouraging people will offer the [place] support with regard to feeding your baby.* Educate yourself and choose the [place] feeding option for your baby. Make a list of people who will guide, support, and be a resource for you as your care for and feed your baby. (Friends that have breastfed or are currently breastfeeding, lactation consultants, breastfeeding support groups, etc.) Consider a postpartum doula. (These websites can give you information: dona.org & padanc.org) Seek out local breastfeeding resources like the breastfeeding support group at [facility] or [employer]. ______________________________________________________________________________________________________________________________________________________________________________________________________________________________________________________________________________________________________________________________________________________________________________________________________  CHORES AND ERRANDS Who can help with a thorough cleaning before baby is born? Make a list of people who will help with housekeeping and chores, like laundry, light cleaning, dishes, bathrooms, etc. Who can run some errands for you? What can you do to make sure you are stocked with basic supplies before baby is born? Who is going to do the  shopping? ______________________________________________________________________________________________________________________________________________________________________________________________________________________________________________________________________________________________________________________________________________________________________________________________________     Family Adjustment *Nurture yourselves.it helps parents be more loving and allows for better bonding with their child.* What sorts of things do you and partner enjoy   doing together? Which activities help you to connect and strengthen your relationship? Make a list of those things. Make a list of people whom you trust to care for your baby so you can have some time together as a couple. What types of things help partner feel connected to Mom? Make a list. What needs will partner have in order to bond with baby? Other children? Who will care for them when you go into labor and while you are in the hospital? Think about what the needs of your older children might be. Who can help you meet those needs? In what ways are you helping them prepare for bringing baby home? List some specific strategies you have for family adjustment. _______________________________________________________________________________________________________________________________________________________________________________________________________________________________________________________________________________________________________________________________________________  SUPPORT *Someone who can empathize with experiences normalizes your problems and makes them more bearable.* Make a list of other friends, neighbors, and/or co-workers you know with infants (and small children, if applicable) with whom you can connect. Make a list of local or online support groups, mom groups, etc. in which you can be  involved. ______________________________________________________________________________________________________________________________________________________________________________________________________________________________________________________________________________________________________________________________________________________________________________________________________  Childcare Plans Investigate and plan for childcare if mom is returning to work. Talk about mom's concerns about her transition back to work. Talk about partner's concerns regarding this transition.  Mental Health *Your mental health is one of the highest priorities for a pregnant or postpartum mom.* 1 in 5 women experience anxiety and/or depression from the time of conception through the first year after birth. Postpartum Mood Disorders are the #1 complication of pregnancy and childbirth and the suffering experienced by these mothers is not necessary! These illnesses are temporary and respond well to treatment, which often includes self-care, social support, talk therapy, and medication when needed. Women experiencing anxiety and depression often say things like: "I'm supposed to be happy.why do I feel so sad?", "Why can't I snap out of it?", "I'm having thoughts that scare me." There is no need to be embarrassed if you are feeling these symptoms: Overwhelmed, anxious, angry, sad, guilty, irritable, hopeless, exhausted but can't sleep You are NOT alone. You are NOT to blame. With help, you WILL be well. Where can I find help? Medical professionals such as your OB, midwife, gynecologist, family practitioner, primary care provider, pediatrician, or mental health providers; [facility] support groups: Feelings After Birth, Breastfeeding Support Group, Baby and Me Group, and Fit 4 Two exercise classes. You have permission to ask for help. It will confirm your feelings, validate your experiences,  share/learn coping strategies, and gain support and encouragement as you heal. You are important! BRAINSTORM Make a list of local resources, including resources for mom and for partner. Identify support groups. Identify people to call late at night - include names and contact info. Talk with partner about perinatal mood and anxiety disorders. Talk with your OB, midwife, and doula about baby blues and about perinatal mood and anxiety disorders. Talk with your pediatrician about perinatal mood and anxiety disorders.   Support & Sanity Savers   What do you really need?  Basics In preparing for a new baby, many expectant parents spend hours shopping for baby clothes, decorating the nursery, and deciding which car seat to buy. Yet most don't think much about what the reality of parenting a newborn will be like, and what they need to make it through that. So, here is the advice of experienced parents. We know you'll read this, and think "they're exaggerating, I don't really need that." Just trust us on these, OK? Plan for all of   this, and if it turns out you don't need it, come back and teach us how you did it!  Must-Haves (Once baby's survival needs are met, make sure you attend to your own survival needs!) Sleep An average newborn sleeps 16-18 hours per day, over 6-7 sleep periods, rarely more than three hours at a time. It is normal and healthy for a newborn to wake throughout the night... but really hard on parents!! Naps. Prioritize sleep above any responsibilities like: cleaning house, visiting friends, running errands, etc.  Sleep whenever baby sleeps. If you can't nap, at least have restful times when baby eats. The more rest you get, the more patient you will be, the more emotionally stable, and better at solving problems.  Food You may not have realized it would be difficult to eat when you have a newborn. Yet, when we talk to countless new parents, they say things like "it may be 2:00 pm  when I realize I haven't had breakfast yet." Or "every time we sit down to dinner, baby needs to eat, and my food gets cold, so I don't bother to eat it." Finger food. Before your baby is born, stock up with one months' worth of food that: 1) you can eat with one hand while holding a baby, 2) doesn't need to be prepped, 3) is good hot or cold, 4) doesn't spoil when left out for a few hours, and 5) you like to eat. Think about: nuts, dried fruit, Clif bars, pretzels, jerky, gogurt, baby carrots, apples, bananas, crackers, cheez-n-crackers, string cheese, hot pockets or frozen burritos to microwave, garden burgers and breakfast pastries to put in the toaster, yogurt drinks, etc. Restaurant Menus. Make lists of your favorite restaurants & menu items. When family/friends want to help, you can give specific information without much thought. They can either bring you the food or send gift cards for just the right meals. Freezer Meals.  Take some time to make a few meals to put in the freezer ahead of time.  Easy to freeze meals can be anything such as soup, lasagna, chicken pie, or spaghetti sauce. Set up a Meal Schedule.  Ask friends and family to sign up to bring you meals during the first few weeks of being home. (It can be passed around at baby showers!) You have no idea how helpful this will be until you are in the throes of parenting.  www.takethemameal.com is a great website to check out. Emotional Support Know who to call when you're stressed out. Parenting a newborn is very challenging work. There are times when it totally overwhelms your normal coping abilities. EVERY NEW PARENT NEEDS TO HAVE A PLAN FOR WHO TO CALL WHEN THEY JUST CAN'T COPE ANY MORE. (And it has to be someone other than the baby's other parent!) Before your baby is born, come up with at least one person you can call for support - write their phone number down and post it on the refrigerator. Anxiety & Sadness. Baby blues are normal after  pregnancy; however, there are more severe types of anxiety & sadness which can occur and should not be ignored.  They are always treatable, but you have to take the first step by reaching out for help. [facility] offers a "Mom Talk" group which meets every Tuesday from 10 am - 11 am.  This group is for new moms who need support and connection after their babies are born.  Call [phone].  Really, Really Helpful (Plan for them!   Make sure these happen often!!) Physical Support with Taking Care of Yourselves Asking friends and family. Before your baby is born, set up a schedule of people who can come and visit and help out (or ask a friend to schedule for you). Any time someone says "let me know what I can do to help," sign them up for a day. When they get there, their job is not to take care of the baby (that's your job and your joy). Their job is to take care of you!  Postpartum doulas. If you don't have anyone you can call on for support, look into postpartum doulas:  professionals at helping parents with caring for baby, caring for themselves, getting breastfeeding started, and helping with household tasks. www.padanc.org is a helpful website for learning about doulas in our area. Peer Support / Parent Groups Why: One of the greatest ideas for new parents is to be around other new parents. Parent groups give you a chance to share and listen to others who are going through the same season of life, get a sense of what is normal infant development by watching several babies learn and grow, share your stories of triumph and struggles with empathetic ears, and forgive your own mistakes when you realize all parents are learning by trial and error. Where to find: There are many places you can meet other new parents throughout our community.  [facility] offers the following classes for new moms and their little ones:  Baby and Me (Birth to Crawling) and Breastfeeding Support Group. Go to  www.conehealthybaby.com or call [phone] for more information. Time for your Relationship It's easy to get so caught up in meeting baby's immediate needs that it's hard to find time to connect with your partner, and meet the needs of your relationship. It's also easy to forget what "quality time with your partner" actually looks like. If you take your baby on a date, you'd be amazed how much of your couple time is spent feeding the baby, diapering the baby, admiring the baby, and talking about the baby. Dating: Try to take time for just the two of you. Babysitter tip: Sometimes when moms are breastfeeding a newborn, they find it hard to figure out how to schedule outings around baby's unpredictable feeding schedules. Have the babysitter come for a three hour period. When she comes over, if baby has just eaten, you can leave right away, and come back in two hours. If baby hasn't fed recently, you start the date at home. Once baby gets hungry and gets a good feeding in, you can head out for the rest of your date time. Date Nights at Home: If you can't get out, at least set aside one evening a week to prioritize your relationship: whenever baby dozes off or doesn't have any immediate needs, spend a little time focusing on each other. Potential conflicts: The main relationship conflicts that come up for new parents are: issues related to sexuality, financial stresses, a feeling of an unfair division of household tasks, and conflicts in parenting styles. The more you can work on these issues before baby arrives, the better!  Fun and Frills (Don't forget these. and don't feel guilty for indulging in them!) Everyone has something in life that is a fun little treat that they do just for themselves. It may be: reading the morning paper, or going for a daily jog, or having coffee with a friend once a week, or going to a movie on Friday nights,   or fine chocolates, or bubble baths, or curling up with a good  book. Unless you do fun things for yourself every now and then, it's hard to have the energy for fun with your baby. Whatever your "special" treats are, make sure you find a way to continue to indulge in them after your baby is born. These special moments can recharge you, and allow you to return to baby with a new joy   PERINATAL MOOD DISORDERS: MATERNAL MENTAL HEALTH FROM CONCEPTION THROUGH THE POSTPARTUM PERIOD   _________________________________________Emergency and Crisis Resources If you are an imminent risk to self or others, are experiencing intense personal distress, and/or have noticed significant changes in activities of daily living, call:  911 [facility]: [phone]  [address], [place], [place], [address] Mobile Crisis: 877-626-1772 National Suicide Hotline: 988 Or visit the following crisis centers: Local Emergency Departments [facility]: [address], [place]  [phone]. Hours: 8:30AM-5PM. Insurance Accepted: Medicaid, Medicare, and Uninsured.  [facility]:  [address], [place]  Mon-Friday 8am-3pm, [phone]                                                                                  ___________ Non-Crisis Resources To identify specific providers that are covered by your insurance, contact your insurance company or local agencies:  [facility]: 1-800-256-2452 [facility]: 888-581-9988 [facility]: 1-800-939-5911 Postpartum Support International- Warm-line: 1-800-944-4773                                                      __Outpatient Therapy and Medication Management   Providers:  [facility]: [phone] Hours: 9AM-5PM  Insurance Accepted: AARP, Aetna, BCBS, Cigna, [employer], Humana, Medicare  [facility] [phone] Hours: 8AM-5:30PM  nsurance Accepted: All insurances EXCEPT AARP, Aetna,  [employer], and Humana [facility]: [phone] Hours: 8AM-8PM Insurance Accepted: Aetna, BCBS, Cigna, [employer], Medicaid, Medicare, Uninsured [facility]: [phone] [facility]: [phone] Hours: 8:30AM-7PM Insurance Accepted: Aetna, BCBS, Medicaid, Medicare, Tricare, United Healthcare [facility]:  [phone]   Hours:9AM-5PM Insurance Accepted:  Aetna, BCBS, [facility], Medicaid, United Health Care  [facility]: [phone] Hours: 9AM-5:30PM Insurance Accepted: AARP, Aetna, BCBS, Cigna, and Medicaid, Medicare, United Health Care [facility]:  [phone] Hours: 9am-5pm Insurance Accepted: BCBS; they do not accept Medicaid/Medicare [facility]: [phone] Hours: 9am-9pm Insurance Accepted: All major insurance including Medicaid and Medicare [facility]: [phone] Hours: 9AM- 5PM Insurance Accepted: All insurances EXCEPT Medicaid and Medicare. [facility]: [phone]   ____________                                                                       Parenting Support Groups [facility]: [phone] [facility]:  [phone] Family Support Network: (support for children in the NICU and/or with special needs), [phone]   ___________                                                                 Mental Health Support Groups Mental Health Association: [phone]    _____________                                                                                  Online Resources Postpartum Support International: http://www.postpartum.net/  800-944-4PPD 2Moms Supporting Moms:  www.momssupportingmoms.net    

## 2023-05-19 ENCOUNTER — Other Ambulatory Visit (HOSPITAL_COMMUNITY): Payer: Self-pay

## 2023-05-19 ENCOUNTER — Other Ambulatory Visit: Payer: Self-pay

## 2023-05-23 ENCOUNTER — Ambulatory Visit: Payer: Medicaid Other | Attending: Obstetrics and Gynecology | Admitting: Obstetrics and Gynecology

## 2023-05-23 ENCOUNTER — Other Ambulatory Visit: Payer: Self-pay | Admitting: Family Medicine

## 2023-05-23 ENCOUNTER — Ambulatory Visit: Payer: Medicaid Other | Attending: Family Medicine

## 2023-05-23 ENCOUNTER — Ambulatory Visit: Payer: Medicaid Other | Admitting: *Deleted

## 2023-05-23 ENCOUNTER — Encounter: Payer: Self-pay | Admitting: *Deleted

## 2023-05-23 VITALS — BP 113/59 | HR 84

## 2023-05-23 DIAGNOSIS — Z3482 Encounter for supervision of other normal pregnancy, second trimester: Secondary | ICD-10-CM

## 2023-05-23 DIAGNOSIS — O321XX0 Maternal care for breech presentation, not applicable or unspecified: Secondary | ICD-10-CM | POA: Insufficient documentation

## 2023-05-23 DIAGNOSIS — O99019 Anemia complicating pregnancy, unspecified trimester: Secondary | ICD-10-CM | POA: Diagnosis not present

## 2023-05-23 DIAGNOSIS — D573 Sickle-cell trait: Secondary | ICD-10-CM

## 2023-05-23 DIAGNOSIS — Z3490 Encounter for supervision of normal pregnancy, unspecified, unspecified trimester: Secondary | ICD-10-CM | POA: Diagnosis present

## 2023-05-23 DIAGNOSIS — F419 Anxiety disorder, unspecified: Secondary | ICD-10-CM | POA: Diagnosis not present

## 2023-05-23 DIAGNOSIS — O99012 Anemia complicating pregnancy, second trimester: Secondary | ICD-10-CM | POA: Diagnosis not present

## 2023-05-23 DIAGNOSIS — Z8659 Personal history of other mental and behavioral disorders: Secondary | ICD-10-CM

## 2023-05-23 DIAGNOSIS — Z3A19 19 weeks gestation of pregnancy: Secondary | ICD-10-CM | POA: Diagnosis not present

## 2023-05-23 DIAGNOSIS — O99342 Other mental disorders complicating pregnancy, second trimester: Secondary | ICD-10-CM | POA: Insufficient documentation

## 2023-05-23 DIAGNOSIS — D571 Sickle-cell disease without crisis: Secondary | ICD-10-CM | POA: Insufficient documentation

## 2023-05-23 DIAGNOSIS — Z3689 Encounter for other specified antenatal screening: Secondary | ICD-10-CM

## 2023-05-23 DIAGNOSIS — F32A Depression, unspecified: Secondary | ICD-10-CM | POA: Diagnosis not present

## 2023-05-23 DIAGNOSIS — Z363 Encounter for antenatal screening for malformations: Secondary | ICD-10-CM | POA: Diagnosis not present

## 2023-05-23 DIAGNOSIS — O283 Abnormal ultrasonic finding on antenatal screening of mother: Secondary | ICD-10-CM

## 2023-05-23 NOTE — Progress Notes (Signed)
  Maternal-Fetal Medicine-Consultation  I had the [facility] of [facility] Estil [facility] She is G1 P0 at 19-weeks' gestation and is here for fetal anatomy scan.  On cell-free fetal Linea screening, the risks of fetal aneuploidies are not increased. She is a carrier of sickle cell trait.  Her partner is not involved in this pregnancy and his carrier status is not known.  Patient reports no chronic medical conditions.  Ultrasound We performed fetal anatomical survey.  Amniotic fluid is normal and good fetal activity seen.  Fetal biometry is consistent with the previously established dates.  An echogenic intracardiac focus is seen.  No other markers of aneuploidies or obvious fetal structural defects are seen. Patient understands the limitations of ultrasound in detecting fetal anomalies.  Our concerns include: Echogenic intracardiac focus (EIF) I counseled the patient that EIF is present in 3 to 4% of normal fetuses and in some fetuses with Down syndrome.  Given that she has low risk for fetal Down syndrome on cell-free fetal DNA screening, this should be considered a normal variant.  I reassured her that echogenic intracardiac focus is not associated with structural heart disease.  Sickle cell trait About 1 in 12 African-American population or carriers of sickle cell trait.  I discussed the genetics.  If her partner is a carrier, there is a 1 in 4 chance of the fetus having sickle cell anemia.  I briefly counseled her on sickle cell anemia. I informed her that her partner can undergo screening.  Alternatively, she can have amniocentesis for prenatal diagnosis.  I explained amniocentesis procedure and possible complication of miscarriage (1 and 500 procedures). Patient opted not to have amniocentesis.  Recommendations -Follow-up scans as clinically indicated.  Thank you for consultation.  If you have any questions or concerns, please contact me the  [facility].  Consultation including face-to-face (more than 50%) counseling 30 minutes.

## 2023-05-30 ENCOUNTER — Encounter: Payer: Self-pay | Admitting: Family Medicine

## 2023-05-30 ENCOUNTER — Other Ambulatory Visit: Payer: Self-pay

## 2023-05-30 ENCOUNTER — Ambulatory Visit (INDEPENDENT_AMBULATORY_CARE_PROVIDER_SITE_OTHER): Payer: Medicaid Other | Admitting: Family Medicine

## 2023-05-30 VITALS — BP 107/70 | HR 96 | Wt 109.2 lb

## 2023-05-30 DIAGNOSIS — D573 Sickle-cell trait: Secondary | ICD-10-CM

## 2023-05-30 DIAGNOSIS — Z3492 Encounter for supervision of normal pregnancy, unspecified, second trimester: Secondary | ICD-10-CM

## 2023-05-30 NOTE — Progress Notes (Signed)
   Subjective:  Ashley Macias is a 23 y.o. G1P0000 at [redacted]w[redacted]d being seen today for ongoing prenatal care.  She is currently monitored for the following issues for this low-risk pregnancy and has Supervision of low-risk pregnancy on their problem list.  Patient reports no complaints.  Contractions: Not present. Vag. Bleeding: None.  Movement: Present. Denies leaking of fluid.   The following portions of the patient's history were reviewed and updated as appropriate: allergies, current medications, past family history, past medical history, past social history, past surgical history and problem list. Problem list updated.  Objective:   Vitals:   05/30/23 0908  BP: 107/70  Pulse: 96  Weight: 109 lb 4 oz (49.6 kg)    Fetal Status: Fetal Heart Rate (bpm): 147   Movement: Present     General:  Alert, oriented and cooperative. Patient is in no acute distress.  Skin: Skin is warm and dry. No rash noted.   Cardiovascular: Normal heart rate noted  Respiratory: Normal respiratory effort, no problems with respiration noted  Abdomen: Soft, gravid, appropriate for gestational age. Pain/Pressure: Absent     Pelvic: Vag. Bleeding: None     Cervical exam deferred        Extremities: Normal range of motion.  Edema: None  Mental Status: Normal mood and affect. Normal behavior. Normal judgment and thought content.   Urinalysis:      Assessment and Plan:  Pregnancy: G1P0000 at 110w0d  1. Encounter for supervision of low-risk pregnancy in second trimester (Primary) BP and FHR normal AFP declined Seems quite tired today, works second shift, discussed self care, getting enough hydration/nutrition  2. Sickle trait Not addressed at today's visit  Preterm labor symptoms and general obstetric precautions including but not limited to vaginal bleeding, contractions, leaking of fluid and fetal movement were reviewed in detail with the patient. Please refer to After Visit Summary for other counseling  recommendations.  Return in 4 weeks (on 06/27/2023) for Dyad patient, ob visit.   Venora Maples, MD

## 2023-05-30 NOTE — Patient Instructions (Signed)

## 2023-05-31 DIAGNOSIS — Z419 Encounter for procedure for purposes other than remedying health state, unspecified: Secondary | ICD-10-CM | POA: Diagnosis not present

## 2023-06-19 ENCOUNTER — Other Ambulatory Visit (HOSPITAL_COMMUNITY): Payer: Self-pay

## 2023-06-23 ENCOUNTER — Encounter: Payer: Self-pay | Admitting: Obstetrics and Gynecology

## 2023-06-23 ENCOUNTER — Ambulatory Visit (INDEPENDENT_AMBULATORY_CARE_PROVIDER_SITE_OTHER): Payer: Medicaid Other | Admitting: Obstetrics and Gynecology

## 2023-06-23 ENCOUNTER — Other Ambulatory Visit: Payer: Self-pay

## 2023-06-23 VITALS — BP 99/68 | HR 83 | Wt 109.6 lb

## 2023-06-23 DIAGNOSIS — Z3A23 23 weeks gestation of pregnancy: Secondary | ICD-10-CM

## 2023-06-23 DIAGNOSIS — Z3492 Encounter for supervision of normal pregnancy, unspecified, second trimester: Secondary | ICD-10-CM

## 2023-06-23 NOTE — Progress Notes (Signed)
 PRENATAL VISIT NOTE  Subjective:  Ashley Macias is a 23 y.o. G1P0000 at [redacted]w[redacted]d being seen today for ongoing prenatal care.  She is currently monitored for the following issues for this low-risk pregnancy and has Supervision of low-risk pregnancy and Sickle cell trait (HCC) on their problem list.  Patient reports no complaints.  Contractions: Not present. Vag. Bleeding: None.  Movement: Present. Denies leaking of fluid.   The following portions of the patient's history were reviewed and updated as appropriate: allergies, current medications, past family history, past medical history, past social history, past surgical history and problem list.   Objective:   Vitals:   06/23/23 1104  BP: 99/68  Pulse: 83  Weight: 109 lb 9 oz (49.7 kg)    Fetal Status: Fetal Heart Rate (bpm): 152 Fundal Height: 23 cm Movement: Present     General:  Alert, oriented and cooperative. Patient is in no acute distress.  Skin: Skin is warm and dry. No rash noted.   Cardiovascular: Normal heart rate noted  Respiratory: Normal respiratory effort, no problems with respiration noted  Abdomen: Soft, gravid, appropriate for gestational age.  Pain/Pressure: Absent     Pelvic: Cervical exam deferred        Extremities: Normal range of motion.  Edema: None  Mental Status: Normal mood and affect. Normal behavior. Normal judgment and thought content.   Assessment and Plan:  Pregnancy: G1P0000 at [redacted]w[redacted]d  Ma was seen today for routine prenatal visit.  [redacted] weeks gestation of pregnancy  Encounter for supervision of low-risk pregnancy in second trimester Overview:  NURSING  PROVIDER  Cons[employer]ator, museum/[facility] for Women Dating by [place] @ [redacted]w[redacted]d  [facility] Anatomy U/S Normal, f/u as indicated  Initiated care at  [facility]  English              LAB RESULTS   Support Person Mom/ Dad Genetics NIPS: low risk AFP: declined    NT/IT (FT only)     Carrier Screen Horizon: sickle trait  Rhogam   A/Positive/-- (11/25 1108) A1C/GTT Early: normal Third trimester:   Flu Vaccine 03/24/2023    TDaP Vaccine   Blood Type A/Positive/-- (11/25 1108)  COVID Vaccine Received- 2 doses Antibody Negative (11/25 1108)  RSV Vaccine  Rubella 15.40 (11/25 1108)  Feeding Plan Bottle RPR Non Reactive (11/25 1108)  Contraception Undecided HBsAg Negative (11/25 1108)  Circumcision If boy- YES HIV Non Reactive (11/25 1108)  Pediatrician  MBCC HCVAb Non Reactive (11/25 1108)  Prenatal Classes       Pap <2 years old  BTL Consent NA GC/CT Initial:  Pos/pos > TOC neg 36wks:    VBAC Consent NA GBS   For PCN allergy, check sensitivities        DME Rx [ ]  BP cuff [ ]  Weight Scale Waterbirth  [ ]  Class [ ]  Consent [ ]  CNM visit  PHQ9 & GAD7 [ x ] new OB [  ] 28 weeks  [  ] 36 weeks Induction  [ ]  Orders Entered [ ] Foley Y/N      Discussed up coming immunizations, glucose testing, birth control options.   Preterm labor symptoms and general obstetric precautions including but not limited to vaginal bleeding, contractions, leaking of fluid and fetal movement were reviewed in detail with the patient.  Please refer to After Visit Summary for other counseling recommendations.   Return in  4 weeks (on 07/21/2023) for LROB, OGTT.  Future Appointments  Date Time Provider Department Center  07/21/2023  8:15 AM Celedonio Savage, MD [facility]  07/21/2023  8:50 AM [facility]  07/28/2023 10:45 AM [facility] CLIN[facility]  08/11/2023  9:15 AM Celedonio Savage, MD [facility]  08/25/2023  9:15 AM Federico Flake, MD [facility]    Wyn Forster, MD FMOB Fellow, Faculty practice [facility], Center for [facility] 06/23/23  1:12 PM

## 2023-06-23 NOTE — Patient Instructions (Signed)
 Second Trimester of Pregnancy  The second trimester of pregnancy is from week 14 through week 27. This is months 4 through 6 of pregnancy. During the second trimester: Morning sickness is less or has stopped. You may have more energy. You may feel hungry more often. At this time, your unborn baby is growing very fast. At the end of the sixth month, the unborn baby may be up to 12 inches long and weigh about 1 pounds. You will likely start to feel the baby move between 16 and 20 weeks of pregnancy. Body changes during your second trimester Your body continues to change during this time. The changes usually go away after your baby is born. Physical changes You will gain more weight. Your belly will get bigger. You may begin to get stretch marks on your hips, belly, and breasts. Your breasts will keep growing and may hurt. You may get dark spots or blotches on your face. A dark line from your belly button to the pubic area may appear. This line is called linea nigra. Your hair may grow faster and get thicker. Health changes You may have headaches. You may have heartburn. You may pee more often. You may have swollen, bulging veins (varicose veins). You may have trouble pooping (constipation), or swollen veins in the butt that can itch or get painful (hemorrhoids). You may have back pain. This is caused by: Weight gain. Pregnancy hormones that are relaxing the joints in your pelvis. Follow these instructions at home: Medicines Talk to your health care provider if you're taking medicines. Ask if the medicines are safe to take during pregnancy. Your provider may change the medicines that you take. Do not take any medicines unless told to by your provider. Take a prenatal vitamin that has at least 600 micrograms (mcg) of folic acid. Do not use herbal medicines, illegal drugs, or medicines that are not approved by your provider. Eating and drinking While you're pregnant your body needs  extra food for your growing baby. Talk with your provider about what to eat while pregnant. Activity Most women are able to exercise during pregnancy. Exercises may need to change as your pregnancy goes on. Talk to your provider about your activities and exercise routines. Relieving pain and discomfort Wear a good, supportive bra if your breasts hurt. Rest with your legs raised if you have leg cramps or low back pain. Take warm sitz baths to soothe pain from hemorrhoids. Use hemorrhoid cream if your provider says it's okay. Do not douche. Do not use tampons or scented pads. Do not use hot tubs, steam rooms, or saunas. Safety Wear your seatbelt at all times when you're in a car. Talk to your provider if someone hits you, hurts you, or yells at you. Talk with your provider if you're feeling sad or have thoughts of hurting yourself. Lifestyle Certain things can be harmful while you're pregnant. It's best to avoid the following: Do not drink alcohol,smoke, vape, or use products with nicotine or tobacco in them. If you need help quitting, talk with your provider. Avoid cat litter boxes and soil used by cats. These things carry germs that can cause harm to your pregnancy and your baby. General instructions Keep all follow-up visits. It helps you and your unborn baby stay as healthy as possible. Write down your questions. Take them to your prenatal visits. Your provider will: Talk with you about your overall health. Give you advice or refer you to specialists who can help with different needs,  including: Prenatal education classes. Mental health and counseling. Foods and healthy eating. Ask for help if you need help with food. Where to find more information American Pregnancy Association: americanpregnancy.org [employer] of Obstetricians and Gynecologists: acog.org Office on [employer] Health: [employer] Contact a health care provider if: You have a headache that does not go away  when you take medicine. You have any of these problems: You can't eat or drink. You throw up or feel like you may throw up. You have watery poop (diarrhea) for 2 days or more. You have pain when you pee or your pee smells bad. You have been sick for 2 days or more and are not getting better. Contact your provider right away if: You have any of these coming from your vagina: Abnormal discharge. Bad-smelling fluid. Bleeding. Your baby is moving less than usual. You have contractions, belly cramping, or have pain in your pelvis or lower back. You have symptoms of high blood pressure or preeclampsia. These include: A severe, throbbing headache that does not go away. Sudden or extreme swelling of your face, hands, legs, or feet. Vision problems: You see spots. You have blurry vision. Your eyes are sensitive to light. If you can't reach the provider, go to an urgent care or emergency room. Get help right away if: You faint, become confused, or can't think clearly. You have chest pain or trouble breathing. You have any kind of injury, such as from a fall or a car crash. These symptoms may be an emergency. Call 911 right away. Do not wait to see if the symptoms will go away. Do not drive yourself to the hospital. This information is not intended to replace advice given to you by your health care provider. Make sure you discuss any questions you have with your health care provider. Document Revised: 01/16/2023 Document Reviewed: 08/16/2022 Elsevier Patient Education  2024 [employer].

## 2023-06-26 ENCOUNTER — Encounter: Payer: Medicaid Other | Admitting: Family Medicine

## 2023-06-28 DIAGNOSIS — Z419 Encounter for procedure for purposes other than remedying health state, unspecified: Secondary | ICD-10-CM | POA: Diagnosis not present

## 2023-07-08 ENCOUNTER — Other Ambulatory Visit (HOSPITAL_COMMUNITY): Payer: Self-pay

## 2023-07-15 ENCOUNTER — Other Ambulatory Visit: Payer: Self-pay

## 2023-07-15 DIAGNOSIS — Z3A26 26 weeks gestation of pregnancy: Secondary | ICD-10-CM

## 2023-07-21 ENCOUNTER — Encounter: Payer: Medicaid Other | Admitting: Family Medicine

## 2023-07-21 ENCOUNTER — Other Ambulatory Visit: Payer: Medicaid Other

## 2023-07-21 NOTE — BH Specialist Note (Unsigned)
 Integrated Behavioral Health via Telemedicine Visit  07/31/2023 PAYTEN HOBIN [id number]  Number of Integrated Behavioral Health Clinician visits: 3- Third Visit  Session Start time: 0920   Session End time: 0926  Total time in minutes: 6   Referring Provider: Lorriane Shire, MD Patient/Family location: Work [facility] Provider location: Center for [employer] at [facility]  All persons participating in visit: Patient Ashley Macias and [facility] Shakerria Parran   Types of Service: Individual psychotherapy and Video visit  I connected with Kieana Rushie Nyhan and/or Margareta O Hoot's  n/a  via  Telephone or Video Enabled Telemedicine Application  (Video is Caregility application) and verified that I am speaking with the correct person using two identifiers. Discussed confidentiality: Yes   I discussed the limitations of telemedicine and the availability of in person appointments.  Discussed there is a possibility of technology failure and discussed alternative modes of communication if that failure occurs.  I discussed that engaging in this telemedicine visit, they consent to the provision of behavioral healthcare and the services will be billed under their insurance.  Patient and/or legal guardian expressed understanding and consented to Telemedicine visit: Yes   Presenting Concerns: Patient and/or family reports the following symptoms/concerns: Mildly anxious; new job is going well, happy to be making money to meet her goals of saving up for a car and preparing for baby; continued mood improvement.  Duration of problem: Pregnancy; Severity of problem: mild  Patient and/or Family's Strengths/Protective Factors: Social connections, Concrete supports in place (healthy food, safe environments, etc.), Sense of purpose, and Physical Health (exercise, healthy diet, medication compliance, etc.)  Goals Addressed: Patient will:  Reduce symptoms of: anxiety and stress     Demonstrate ability to: Increase healthy adjustment to current life circumstances  Progress towards Goals: Ongoing  Interventions: Interventions utilized:  Motivational Interviewing and Supportive Reflection Standardized Assessments completed: Not Needed  Patient and/or Family Response: Patient agrees with treatment plan.   Assessment: Patient currently experiencing Adjustment disorder with mixed anxious and depressed mood; Psychosocial stress.   Patient may benefit from continued therapeutic intervention. .  Plan: Follow up with behavioral health clinician on : Call Danaysha Kirn at [phone], as needed. Behavioral recommendations:  -Continue healthy self-care daily (healthy meals and sleep; time with supportive people in life) -Continue working and saving money towards a car and preparing for baby's arrival -Read through information on After Visit Summary; use as needed and discussed Referral(s): Integrated [employer] (In Clinic)  I discussed the assessment and treatment plan with the patient and/or parent/guardian. They were provided an opportunity to ask questions and all were answered. They agreed with the plan and demonstrated an understanding of the instructions.   They were advised to call back or seek an in-person evaluation if the symptoms worsen or if the condition fails to improve as anticipated.  Rae Lips, LCSW     05/12/2023   10:00 AM 03/24/2023   10:48 AM 05/02/2021   10:27 AM 03/28/2020    4:10 PM 03/07/2020    5:08 AM  Depression screen PHQ 2/9  Decreased Interest 0 1 1 1 1   Down, Depressed, Hopeless 0 1 0 0 0  PHQ - 2 Score 0 2 1 1 1   Altered sleeping 0 2  0 0  Tired, decreased energy 0 2 1 1 1   Change in appetite 0 2 0 0 0  Feeling bad or failure about yourself  0 0 0 0 0  Trouble concentrating  0 0 1 1 1   Moving slowly or fidgety/restless 0 2 0 0 0  Suicidal thoughts 0 0 0 0 0  PHQ-9 Score 0 10  3 3   Difficult doing work/chores   Not difficult at all Somewhat difficult        05/12/2023   10:02 AM 03/24/2023   10:48 AM 03/28/2020    4:10 PM 03/07/2020    5:07 AM  GAD 7 : Generalized Anxiety Score  Nervous, Anxious, on Edge 0 0 0 1  Control/stop worrying 0 0 0 0  Worry too much - different things 0 0 0 0  Trouble relaxing 0 0 1 0  Restless 0 0 0 0  Easily annoyed or irritable 0 3 0 0  Afraid - awful might happen 0 1 0 0  Total GAD 7 Score 0 4 1 1   Anxiety Difficulty  Not difficult at all

## 2023-07-23 ENCOUNTER — Other Ambulatory Visit: Payer: Medicaid Other

## 2023-07-23 ENCOUNTER — Encounter: Payer: Medicaid Other | Admitting: Family Medicine

## 2023-07-25 ENCOUNTER — Encounter: Admitting: Family Medicine

## 2023-07-25 ENCOUNTER — Other Ambulatory Visit (HOSPITAL_COMMUNITY)
Admission: RE | Admit: 2023-07-25 | Discharge: 2023-07-25 | Disposition: A | Discharge status: Home / Self Care | Source: Ambulatory Visit | Attending: Family Medicine | Admitting: Family Medicine

## 2023-07-25 ENCOUNTER — Other Ambulatory Visit

## 2023-07-25 ENCOUNTER — Other Ambulatory Visit: Payer: Self-pay

## 2023-07-25 ENCOUNTER — Ambulatory Visit: Admitting: Family Medicine

## 2023-07-25 VITALS — BP 104/72 | HR 85 | Wt 112.7 lb

## 2023-07-25 DIAGNOSIS — Z3493 Encounter for supervision of normal pregnancy, unspecified, third trimester: Secondary | ICD-10-CM | POA: Diagnosis not present

## 2023-07-25 DIAGNOSIS — Z3490 Encounter for supervision of normal pregnancy, unspecified, unspecified trimester: Secondary | ICD-10-CM

## 2023-07-25 DIAGNOSIS — Z3A26 26 weeks gestation of pregnancy: Secondary | ICD-10-CM | POA: Diagnosis not present

## 2023-07-25 DIAGNOSIS — Z3A28 28 weeks gestation of pregnancy: Secondary | ICD-10-CM | POA: Diagnosis not present

## 2023-07-25 DIAGNOSIS — Z3492 Encounter for supervision of normal pregnancy, unspecified, second trimester: Secondary | ICD-10-CM | POA: Diagnosis not present

## 2023-07-25 NOTE — Progress Notes (Signed)
   PRENATAL VISIT NOTE  Subjective:  Ashley Macias is a 23 y.o. G1P0000 at [redacted]w[redacted]d being seen today for ongoing prenatal care.  She is currently monitored for the following issues for this low-risk pregnancy and has Supervision of low-risk pregnancy and Sickle cell trait (HCC) on their problem list.  Patient reports no bleeding, no contractions, no cramping, and no leaking.  Contractions: Not present. Vag. Bleeding: None.  Movement: Absent. Denies leaking of fluid.   The following portions of the patient's history were reviewed and updated as appropriate: allergies, current medications, past family history, past medical history, past social history, past surgical history and problem list.   Objective:   Vitals:   07/25/23 0837  BP: 104/72  Pulse: 85  Weight: 112 lb 11.2 oz (51.1 kg)    Fetal Status: Fetal Heart Rate (bpm): 143   Movement: Absent     General:  Alert, oriented and cooperative. Patient is in no acute distress.  Skin: Skin is warm and dry. No rash noted.   Cardiovascular: Normal heart rate noted  Respiratory: Normal respiratory effort, no problems with respiration noted  Abdomen: Soft, gravid, appropriate for gestational age.  Pain/Pressure: Absent     Pelvic: Cervical exam deferred        Extremities: Normal range of motion.  Edema: None  Mental Status: Normal mood and affect. Normal behavior. Normal judgment and thought content.   Assessment and Plan:  Pregnancy: G1P0000 at [redacted]w[redacted]d 1. Encounter for supervision of low-risk pregnancy, antepartum (Primary) FHR and BP appropriate today Patient is having abnormal vaginal discharge with irritation.  Wet prep collected today - Cervicovaginal ancillary only  2. [redacted] weeks gestation of pregnancy 28-week labs being collected today  Preterm labor symptoms and general obstetric precautions including but not limited to vaginal bleeding, contractions, leaking of fluid and fetal movement were reviewed in detail with the  patient. Please refer to After Visit Summary for other counseling recommendations.   No follow-ups on file.  Future Appointments  Date Time Provider Department Center  07/31/2023  9:15 AM [facility] HEALTH CLINICIAN [facility]  08/11/2023  9:15 AM Celedonio Savage, MD [facility]  08/25/2023  9:15 AM Federico Flake, MD [facility]    Celedonio Savage, MD

## 2023-07-26 LAB — CBC
Hematocrit: 29.2 % — ABNORMAL LOW (ref 34.0–46.6)
Hemoglobin: 9.4 g/dL — ABNORMAL LOW (ref 11.1–15.9)
MCH: 26.7 pg (ref 26.6–33.0)
MCHC: 32.2 g/dL (ref 31.5–35.7)
MCV: 83 fL (ref 79–97)
Platelets: 448 10*3/uL (ref 150–450)
RBC: 3.52 x10E6/uL — ABNORMAL LOW (ref 3.77–5.28)
RDW: 14 % (ref 11.7–15.4)
WBC: 8.4 10*3/uL (ref 3.4–10.8)

## 2023-07-26 LAB — GLUCOSE TOLERANCE, 2 HOURS W/ 1HR
Glucose, 1 hour: 111 mg/dL (ref 70–179)
Glucose, 2 hour: 102 mg/dL (ref 70–152)
Glucose, Fasting: 67 mg/dL — ABNORMAL LOW (ref 70–91)

## 2023-07-26 LAB — HIV ANTIBODY (ROUTINE TESTING W REFLEX): HIV Screen 4th Generation wRfx: NONREACTIVE

## 2023-07-26 LAB — RPR: RPR Ser Ql: NONREACTIVE

## 2023-07-28 ENCOUNTER — Encounter: Payer: Self-pay | Admitting: Family Medicine

## 2023-07-28 LAB — CERVICOVAGINAL ANCILLARY ONLY
Bacterial Vaginitis (gardnerella): NEGATIVE
Candida Glabrata: NEGATIVE
Candida Vaginitis: POSITIVE — AB
Chlamydia: NEGATIVE
Comment: NEGATIVE
Comment: NEGATIVE
Comment: NEGATIVE
Comment: NEGATIVE
Comment: NEGATIVE
Comment: NORMAL
Neisseria Gonorrhea: NEGATIVE
Trichomonas: NEGATIVE

## 2023-07-29 ENCOUNTER — Other Ambulatory Visit: Payer: Self-pay

## 2023-07-29 ENCOUNTER — Encounter: Payer: Self-pay | Admitting: Family Medicine

## 2023-07-29 MED ORDER — FLUCONAZOLE 150 MG PO TABS
150.0000 mg | ORAL_TABLET | Freq: Once | ORAL | 0 refills | Status: AC
  Filled 2023-07-29 – 2023-07-30 (×2): qty 1, 1d supply, fill #0

## 2023-07-29 NOTE — Addendum Note (Signed)
 Addended by: Celedonio Savage on: 07/29/2023 09:04 AM   Modules accepted: Orders

## 2023-07-30 ENCOUNTER — Other Ambulatory Visit: Payer: Self-pay

## 2023-07-31 ENCOUNTER — Ambulatory Visit: Admitting: Clinical

## 2023-07-31 DIAGNOSIS — Z658 Other specified problems related to psychosocial circumstances: Secondary | ICD-10-CM

## 2023-07-31 DIAGNOSIS — F4323 Adjustment disorder with mixed anxiety and depressed mood: Secondary | ICD-10-CM

## 2023-07-31 NOTE — Patient Instructions (Signed)
 Center for [facility] [address] [place], [place] [address] [phone] (main office) [phone] (Ignacio Lowder's office)     BRAINSTORMING  Develop a Plan Goals: Provide a way to start conversation about your new life with a baby Assist parents in recognizing and using resources within their reach Help pave the way before birth for an easier period of transition afterwards.  Make a list of the following information to keep in a central location: Full name of Mom and Partner: _____________________________________________ Baby's full name and Date of Birth: ___________________________________________ Home Address: ___________________________________________________________ ________________________________________________________________________ Home Phone: ____________________________________________________________ Parents' cell numbers: _____________________________________________________ ________________________________________________________________________ Name and contact info for OB: ______________________________________________ Name and contact info for Pediatrician:________________________________________ Contact info for Lactation Consultants: ________________________________________  REST and SLEEP *You each need at least 4-5 hours of uninterrupted sleep every day. Write specific names and contact information.* How are you going to rest in the postpartum period? While partner's home? When partner returns to work? When you both return to work? Where will your baby sleep? Who is available to help during the day? Evening? Night? Who could move in for a period to help support you? What are some ideas to help you get enough  sleep? __________________________________________________________________________________________________________________________________________________________________________________________________________________________________________ NUTRITIOUS FOOD AND DRINK *Plan for meals before your baby is born so you can have healthy food to eat during the immediate postpartum period.* Who will look after breakfast? Lunch? Dinner? List names and contact information. Brainstorm quick, healthy ideas for each meal. What can you do before baby is born to prepare meals for the postpartum period? How can others help you with meals? Which grocery stores provide online shopping and delivery? Which restaurants offer take-out or delivery options? ______________________________________________________________________________________________________________________________________________________________________________________________________________________________________________________________________________________________________________________________________________________________________________________________________  CARE FOR MOM *It's important that mom is cared for and pampered in the postpartum period. Remember, the most important ways new mothers need care are: sleep, nutrition, gentle exercise, and time off.* Who can come take care of mom during this period? Make a list of people with their contact information. List some activities that make you feel cared for, rested, and energized? Who can make sure you have opportunities to do these things? Does mom have a space of her very own within your home that's just for her? Make a "[facility]" where she can be comfortable, rest, and renew herself  daily. ______________________________________________________________________________________________________________________________________________________________________________________________________________________________________________________________________________________________________________________________________________________________________________________________________    CARE FOR AND FEEDING BABY *Knowledgeable and encouraging people will offer the best support with regard to feeding your baby.* Educate yourself and choose the best feeding option for your baby. Make a list of people who will guide, support, and be a resource for you as your care for and feed your baby. (Friends that have breastfed or are currently breastfeeding, lactation consultants, breastfeeding support groups, etc.) Consider a postpartum doula. (These websites can give you information: dona.org & https://shea.org/) Seek out local breastfeeding resources like the breastfeeding support group at [employer] or [employer]. ______________________________________________________________________________________________________________________________________________________________________________________________________________________________________________________________________________________________________________________________________________________________________________________________________  Judson Roch AND ERRANDS Who can help with a thorough cleaning before baby is born? Make a list of people who will help with housekeeping and chores, like laundry, light cleaning, dishes, bathrooms, etc. Who can run some errands for you? What can you do to make sure you are stocked with basic supplies before baby is born? Who is going to do the  shopping? ______________________________________________________________________________________________________________________________________________________________________________________________________________________________________________________________________________________________________________________________________________________________________________________________________     Family Adjustment *Nurture yourselves.it helps parents be more loving and allows for better bonding with their child.* What sorts of things do you and partner enjoy  doing together? Which activities help you to connect and strengthen your relationship? Make a list of those things. Make a list of people whom you trust to care for your baby so you can have some time together as a couple. What types of things help partner feel connected to Mom? Make a list. What needs will partner have in order to bond with baby? Other children? Who will care for them when you go into labor and while you are in the hospital? Think about what the needs of your older children might be. Who can help you meet those needs? In what ways are you helping them prepare for bringing baby home? List some specific strategies you have for family adjustment. _______________________________________________________________________________________________________________________________________________________________________________________________________________________________________________________________________________________________________________________________________________  SUPPORT *Someone who can empathize with experiences normalizes your problems and makes them more bearable.* Make a list of other friends, neighbors, and/or co-workers you know with infants (and small children, if applicable) with whom you can connect. Make a list of local or online support groups, mom groups, etc. in which you can be  involved. ______________________________________________________________________________________________________________________________________________________________________________________________________________________________________________________________________________________________________________________________________________________________________________________________________  Childcare Plans Investigate and plan for childcare if mom is returning to work. Talk about mom's concerns about her transition back to work. Talk about partner's concerns regarding this transition.  Mental Health *Your mental health is one of the highest priorities for a pregnant or postpartum mom.* 1 in 5 women experience anxiety and/or depression from the time of conception through the first year after birth. Postpartum Mood Disorders are the #1 complication of pregnancy and childbirth and the suffering experienced by these mothers is not necessary! These illnesses are temporary and respond well to treatment, which often includes self-care, social support, talk therapy, and medication when needed. Women experiencing anxiety and depression often say things like: "I'm supposed to be happy.why do I feel so sad?", "Why can't I snap out of it?", "I'm having thoughts that scare me." There is no need to be embarrassed if you are feeling these symptoms: Overwhelmed, anxious, angry, sad, guilty, irritable, hopeless, exhausted but can't sleep You are NOT alone. You are NOT to blame. With help, you WILL be well. Where can I find help? Medical professionals such as your OB, midwife, gynecologist, family practitioner, primary care provider, pediatrician, or mental health providers; [facility] support groups: Feelings After Birth, Breastfeeding Support Group, Baby and Me Group, and Fit 4 Two exercise classes. You have permission to ask for help. It will confirm your feelings, validate your experiences,  share/learn coping strategies, and gain support and encouragement as you heal. You are important! BRAINSTORM Make a list of local resources, including resources for mom and for partner. Identify support groups. Identify people to call late at night - include names and contact info. Talk with partner about perinatal mood and anxiety disorders. Talk with your OB, midwife, and doula about baby blues and about perinatal mood and anxiety disorders. Talk with your pediatrician about perinatal mood and anxiety disorders.   Support & Sanity Savers   What do you really need?  Basics In preparing for a new baby, many expectant parents spend hours shopping for baby clothes, decorating the nursery, and deciding which car seat to buy. Yet most don't think much about what the reality of parenting a newborn will be like, and what they need to make it through that. So, here is the advice of experienced parents. We know you'll read this, and think "they're exaggerating, I don't really need that." Just trust [place] on these, OK? Plan for all of  this, and if it turns out you don't need it, come back and teach [place] how you did it!  Must-Haves (Once baby's survival needs are met, make sure you attend to your own survival needs!) Sleep An average newborn sleeps 16-18 hours per day, over 6-7 sleep periods, rarely more than three hours at a time. It is normal and healthy for a newborn to wake throughout the night... but really hard on parents!! Naps. Prioritize sleep above any responsibilities like: cleaning house, visiting friends, running errands, etc.  Sleep whenever baby sleeps. If you can't nap, at least have restful times when baby eats. The more rest you get, the more patient you will be, the more emotionally stable, and better at solving problems.  Food You may not have realized it would be difficult to eat when you have a newborn. Yet, when we talk to countless new parents, they say things like "it may be 2:00 pm  when I realize I haven't had breakfast yet." Or "every time we sit down to dinner, baby needs to eat, and my food gets cold, so I don't bother to eat it." Finger food. Before your baby is born, stock up with one months' worth of food that: 1) you can eat with one hand while holding a baby, 2) doesn't need to be prepped, 3) is good hot or cold, 4) doesn't spoil when left out for a few hours, and 5) you like to eat. Think about: nuts, dried fruit, Clif bars, pretzels, jerky, gogurt, baby carrots, apples, bananas, crackers, cheez-n-crackers, string cheese, hot pockets or frozen burritos to microwave, garden burgers and breakfast pastries to put in the toaster, yogurt drinks, etc. Restaurant Menus. Make lists of your favorite restaurants & menu items. When family/friends want to help, you can give specific information without much thought. They can either bring you the food or send gift cards for just the right meals. Freezer Meals.  Take some time to make a few meals to put in the freezer ahead of time.  Easy to freeze meals can be anything such as soup, lasagna, chicken pie, or spaghetti sauce. Set up a Meal Schedule.  Ask friends and family to sign up to bring you meals during the first few weeks of being home. (It can be passed around at baby showers!) You have no idea how helpful this will be until you are in the throes of parenting.  Machine[employer].it is a great website to check out. Emotional Support Know who to call when you're stressed out. Parenting a newborn is very challenging work. There are times when it totally overwhelms your normal coping abilities. EVERY NEW PARENT NEEDS TO HAVE A PLAN FOR WHO TO CALL WHEN THEY JUST CAN'T COPE ANY MORE. (And it has to be someone other than the baby's other parent!) Before your baby is born, come up with at least one person you can call for support - write their phone number down and post it on the refrigerator. Anxiety & Sadness. Baby blues are normal after  pregnancy; however, there are more severe types of anxiety & sadness which can occur and should not be ignored.  They are always treatable, but you have to take the first step by reaching out for help. [facility] offers a "Mom Talk" group which meets every Tuesday from 10 am - 11 am.  This group is for new moms who need support and connection after their babies are born.  Call [phone].  Really, Really Helpful (Plan for them!  Make sure these happen often!!) Physical Support with Taking Care of Yourselves Asking friends and family. Before your baby is born, set up a schedule of people who can come and visit and help out (or ask a friend to schedule for you). Any time someone says "let me know what I can do to help," sign them up for a day. When they get there, their job is not to take care of the baby (that's your job and your joy). Their job is to take care of you!  Postpartum doulas. If you don't have anyone you can call on for support, look into postpartum doulas:  professionals at helping parents with caring for baby, caring for themselves, getting breastfeeding started, and helping with household tasks. www.padanc.org is a helpful website for learning about doulas in our area. Peer Support / Parent Groups Why: One of the greatest ideas for new parents is to be around other new parents. Parent groups give you a chance to share and listen to others who are going through the same season of life, get a sense of what is normal infant development by watching several babies learn and grow, share your stories of triumph and struggles with empathetic ears, and forgive your own mistakes when you realize all parents are learning by trial and error. Where to find: There are many places you can meet other new parents throughout our community.  [facility] offers the following classes for new moms and their little ones:  Baby and Me (Birth to Crawling) and Breastfeeding Support Group. Go to  www.conehealthybaby.com or call [phone] for more information. Time for your Relationship It's easy to get so caught up in meeting baby's immediate needs that it's hard to find time to connect with your partner, and meet the needs of your relationship. It's also easy to forget what "quality time with your partner" actually looks like. If you take your baby on a date, you'd be amazed how much of your couple time is spent feeding the baby, diapering the baby, admiring the baby, and talking about the baby. Dating: Try to take time for just the two of you. Babysitter tip: Sometimes when moms are breastfeeding a newborn, they find it hard to figure out how to schedule outings around baby's unpredictable feeding schedules. Have the babysitter come for a three hour period. When she comes over, if baby has just eaten, you can leave right away, and come back in two hours. If baby hasn't fed recently, you start the date at home. Once baby gets hungry and gets a good feeding in, you can head out for the rest of your date time. Date Nights at Home: If you can't get out, at least set aside one evening a week to prioritize your relationship: whenever baby dozes off or doesn't have any immediate needs, spend a little time focusing on each other. Potential conflicts: The main relationship conflicts that come up for new parents are: issues related to sexuality, financial stresses, a feeling of an unfair division of household tasks, and conflicts in parenting styles. The more you can work on these issues before baby arrives, the better!  Fun and Frills (Don't forget these. and don't feel guilty for indulging in them!) Everyone has something in life that is a fun little treat that they do just for themselves. It may be: reading the morning paper, or going for a daily jog, or having coffee with a friend once a week, or going to a movie on Friday nights,  or fine chocolates, or bubble baths, or curling up with a good  book. Unless you do fun things for yourself every now and then, it's hard to have the energy for fun with your baby. Whatever your "special" treats are, make sure you find a way to continue to indulge in them after your baby is born. These special moments can recharge you, and allow you to return to baby with a new joy   PERINATAL MOOD DISORDERS: MATERNAL MENTAL HEALTH FROM CONCEPTION THROUGH THE POSTPARTUM PERIOD   _________________________________________Emergency and Crisis Resources If you are an imminent risk to self or others, are experiencing intense personal distress, and/or have noticed significant changes in activities of daily living, call:  [facility]: [phone]  [address] [place], [place], [address] Mobile Crisis: [phone] National Suicide Hotline: 22 Or visit the following crisis centers: Local Emergency Departments [facility]:  [address], [place]  Mon-Friday 8am-3pm, [phone]                                                                                  ___________ Non-Crisis Resources To identify specific providers that are covered by your insurance, contact your insurance company or local agencies:   Postpartum Support International- Warm-line: 629-532-6019                                                      __Outpatient Therapy and Medication Management   Providers:  [facility]: [phone] Hours: 9AM-5PM  Insurance Accepted: Pilar Jarvis, BCBS, Crista Luria, [place], Medicare  [facility]): [phone] Hours: 8AM-5:30PM  nsurance Accepted: All insurances EXCEPT AARP, [place], [place], and [employer] of the [place]: [phone] Hours: 8AM-8PM Insurance Accepted: Ulla Gallo, Crista Luria, [place], Medicare, [phone] [facility]: [phone] Hours: 8:30AM-7PM Insurance Accepted: Ulla Gallo, Medicaid, Medicare, Tricare, [employer] [facility]:  [phone]   Hours:9AM-5PM Insurance Accepted:  Monia Pouch, Ezequiel Essex, [employer], [place], [employer] [facility]  [facility]: [phone] Hours: 9AM-5:30PM Insurance Accepted: Pilar Jarvis, Teodoro Spray, and Medicaid, Medicare, [facility]:  [phone] Hours: 9am-5pm Insurance Accepted: BCBS; they do not accept Medicaid/Medicare [facility]: [phone] Hours: 9am-9pm Insurance Accepted: All major insurance including Medicaid and Medicare [facility]: [phone] Hours: 9AM- 5PM Insurance Accepted: All insurances EXCEPT Medicaid and Medicare. [facility]: [phone]   ____________                                                                     Parenting Support Groups [facility] at [facility] :  [address], [place], [place], [address] [phone] [facility]:  [phone] Family Support Network: (support for children in the NICU and/or with special needs), [phone]     _____________                                                                                  Online Resources Postpartum Support International: SeekAlumni.co.za  800-944-4PPD Supporting Moms:  www.momssupportingmoms.net

## 2023-08-09 DIAGNOSIS — Z419 Encounter for procedure for purposes other than remedying health state, unspecified: Secondary | ICD-10-CM | POA: Diagnosis not present

## 2023-08-11 ENCOUNTER — Telehealth: Payer: Medicaid Other | Admitting: Family Medicine

## 2023-08-11 DIAGNOSIS — Z3492 Encounter for supervision of normal pregnancy, unspecified, second trimester: Secondary | ICD-10-CM | POA: Diagnosis not present

## 2023-08-11 DIAGNOSIS — F419 Anxiety disorder, unspecified: Secondary | ICD-10-CM

## 2023-08-11 DIAGNOSIS — Z3A30 30 weeks gestation of pregnancy: Secondary | ICD-10-CM

## 2023-08-11 DIAGNOSIS — D573 Sickle-cell trait: Secondary | ICD-10-CM | POA: Diagnosis not present

## 2023-08-11 DIAGNOSIS — F32A Depression, unspecified: Secondary | ICD-10-CM

## 2023-08-11 NOTE — Progress Notes (Signed)
   OBSTETRICS PRENATAL VIRTUAL VISIT ENCOUNTER NOTE  Provider location: Center for [facility]   Patient location: Work  I connected with Gerhard Knuckles on 08/11/23 at  9:15 AM EDT by MyChart Video Encounter and verified that I am speaking with the correct person using two identifiers. I discussed the limitations, risks, security and privacy concerns of performing an evaluation and management service virtually and the availability of in person appointments. I also discussed with the patient that there may be a patient responsible charge related to this service. The patient expressed understanding and agreed to proceed. Subjective:  Ashley Macias is a 23 y.o. G1P0000 at [redacted]w[redacted]d being seen today for ongoing prenatal care.  She is currently monitored for the following issues for this low-risk pregnancy and has Supervision of low-risk pregnancy and Sickle cell trait (HCC) on their problem list.  Patient reports no bleeding, no contractions, no cramping, and no leaking.  Contractions: Not present. Vag. Bleeding: None.  Movement: Present. Denies any leaking of fluid.   The following portions of the patient's history were reviewed and updated as appropriate: allergies, current medications, past family history, past medical history, past social history, past surgical history and problem list.   Objective:  There were no vitals filed for this visit.  Fetal Status:     Movement: Present     General:  Alert, oriented and cooperative. Patient is in no acute distress.  Respiratory: Normal respiratory effort, no problems with respiration noted  Mental Status: Normal mood and affect. Normal behavior. Normal judgment and thought content.  Rest of physical exam deferred due to type of encounter  Imaging: No results found.  Assessment and Plan:  Pregnancy: G1P0000 at [redacted]w[redacted]d 1. Encounter for supervision of low-risk pregnancy in second trimester (Primary) No concerns at this  time Has not yet taken yeast treatment.  Has that at her house.  2. Sickle cell trait (HCC) Partner not tested, not involved in family life  3. Anxiety and depression Meeting with behavioral health and feels benefit from this.  Will continue these meetings  4. [redacted] weeks gestation of pregnancy Follow-up in 2 weeks  Preterm labor symptoms and general obstetric precautions including but not limited to vaginal bleeding, contractions, leaking of fluid and fetal movement were reviewed in detail with the patient. I discussed the assessment and treatment plan with the patient. The patient was provided an opportunity to ask questions and all were answered. The patient agreed with the plan and demonstrated an understanding of the instructions. The patient was advised to call back or seek an in-person office evaluation/go to [facility] at [facility] for any urgent or concerning symptoms. Please refer to After Visit Summary for other counseling recommendations.   I provided 12 minutes of face-to-face time during this encounter.  No follow-ups on file.  Future Appointments  Date Time Provider Department Center  08/11/2023  9:15 AM Ferdie Housekeeper, MD [facility]  08/27/2023  3:35 PM Ferdie Housekeeper, MD [facility]  09/09/2023  9:35 AM Teena Feast, MD [facility]  09/24/2023  9:55 AM Randel Buss, Mardee Shackle, MD [facility]    Ferdie Housekeeper, MD Center for [facility]

## 2023-08-25 ENCOUNTER — Encounter: Payer: Medicaid Other | Admitting: Family Medicine

## 2023-08-27 ENCOUNTER — Other Ambulatory Visit: Payer: Self-pay

## 2023-08-27 ENCOUNTER — Ambulatory Visit (INDEPENDENT_AMBULATORY_CARE_PROVIDER_SITE_OTHER): Admitting: Family Medicine

## 2023-08-27 VITALS — BP 98/63 | HR 80 | Wt 115.4 lb

## 2023-08-27 DIAGNOSIS — D573 Sickle-cell trait: Secondary | ICD-10-CM

## 2023-08-27 DIAGNOSIS — Z3A32 32 weeks gestation of pregnancy: Secondary | ICD-10-CM

## 2023-08-27 DIAGNOSIS — Z3492 Encounter for supervision of normal pregnancy, unspecified, second trimester: Secondary | ICD-10-CM

## 2023-08-27 DIAGNOSIS — F32A Depression, unspecified: Secondary | ICD-10-CM

## 2023-08-27 DIAGNOSIS — F419 Anxiety disorder, unspecified: Secondary | ICD-10-CM

## 2023-08-28 NOTE — Progress Notes (Signed)
   PRENATAL VISIT NOTE  Subjective:  Ashley Macias is a 23 y.o. G1P0000 at [redacted]w[redacted]d being seen today for ongoing prenatal care.  She is currently monitored for the following issues for this low-risk pregnancy and has Supervision of low-risk pregnancy and Sickle cell trait (HCC) on their problem list.  Patient reports no bleeding, no contractions, no cramping, and no leaking.  Contractions: Not present. Vag. Bleeding: None.  Movement: Present. Denies leaking of fluid.   The following portions of the patient's history were reviewed and updated as appropriate: allergies, current medications, past family history, past medical history, past social history, past surgical history and problem list.   Objective:   Vitals:   08/27/23 1608  BP: 98/63  Pulse: 80  Weight: 115 lb 6.4 oz (52.3 kg)    Fetal Status: Fetal Heart Rate (bpm): 140   Movement: Present     General:  Alert, oriented and cooperative. Patient is in no acute distress.  Skin: Skin is warm and dry. No rash noted.   Cardiovascular: Normal heart rate noted  Respiratory: Normal respiratory effort, no problems with respiration noted  Abdomen: Soft, gravid, appropriate for gestational age.  Pain/Pressure: Absent     Pelvic: Cervical exam deferred        Extremities: Normal range of motion.  Edema: None  Mental Status: Normal mood and affect. Normal behavior. Normal judgment and thought content.   Assessment and Plan:  Pregnancy: G1P0000 at [redacted]w[redacted]d 1. Encounter for supervision of low-risk pregnancy in second trimester (Primary) FHR and BP appropriate today Continue routine prenatal care  2. Sickle cell trait (HCC) Partner has not been tested, not involved in pregnancy  3. Anxiety and depression Doing well, no concerns.  Meets with behavioral health and feels like she is getting benefit from this.  Planning on continuing these meetings.  4. [redacted] weeks gestation of pregnancy   Preterm labor symptoms and general obstetric  precautions including but not limited to vaginal bleeding, contractions, leaking of fluid and fetal movement were reviewed in detail with the patient. Please refer to After Visit Summary for other counseling recommendations.   No follow-ups on file.  Future Appointments  Date Time Provider Department Center  09/09/2023  9:35 AM Teena Feast, MD [facility]  09/24/2023  9:55 AM Randel Buss, Mardee Shackle, MD [facility]    Ferdie Housekeeper, MD

## 2023-09-08 DIAGNOSIS — Z419 Encounter for procedure for purposes other than remedying health state, unspecified: Secondary | ICD-10-CM | POA: Diagnosis not present

## 2023-09-09 ENCOUNTER — Encounter: Payer: Self-pay | Admitting: Family Medicine

## 2023-09-09 ENCOUNTER — Telehealth: Payer: Self-pay | Admitting: Family Medicine

## 2023-09-09 ENCOUNTER — Other Ambulatory Visit: Payer: Self-pay

## 2023-09-09 ENCOUNTER — Other Ambulatory Visit (HOSPITAL_COMMUNITY)
Admission: RE | Admit: 2023-09-09 | Discharge: 2023-09-09 | Disposition: A | Discharge status: Home / Self Care | Source: Ambulatory Visit | Attending: Family Medicine | Admitting: Family Medicine

## 2023-09-09 ENCOUNTER — Ambulatory Visit: Admitting: Family Medicine

## 2023-09-09 VITALS — BP 97/67 | HR 91 | Wt 116.0 lb

## 2023-09-09 DIAGNOSIS — O23593 Infection of other part of genital tract in pregnancy, third trimester: Secondary | ICD-10-CM

## 2023-09-09 DIAGNOSIS — O26843 Uterine size-date discrepancy, third trimester: Secondary | ICD-10-CM | POA: Insufficient documentation

## 2023-09-09 DIAGNOSIS — N898 Other specified noninflammatory disorders of vagina: Secondary | ICD-10-CM | POA: Insufficient documentation

## 2023-09-09 DIAGNOSIS — Z3A34 34 weeks gestation of pregnancy: Secondary | ICD-10-CM

## 2023-09-09 DIAGNOSIS — Z3493 Encounter for supervision of normal pregnancy, unspecified, third trimester: Secondary | ICD-10-CM

## 2023-09-09 NOTE — Progress Notes (Signed)
   Subjective:  Ashley Macias is a 23 y.o. G1P0000 at [redacted]w[redacted]d being seen today for ongoing prenatal care.  She is currently monitored for the following issues for this low-risk pregnancy and has Supervision of low-risk pregnancy; Sickle cell trait (HCC); and Significant discrepancy between uterine size and clinical dates, antepartum, third trimester on their problem list.  Patient reports no complaints.  Contractions: Not present. Vag. Bleeding: None.  Movement: Present. Denies leaking of fluid.   The following portions of the patient's history were reviewed and updated as appropriate: allergies, current medications, past family history, past medical history, past social history, past surgical history and problem list. Problem list updated.  Objective:   Vitals:   09/09/23 1005  BP: 97/67  Pulse: 91  Weight: 116 lb (52.6 kg)    Fetal Status: Fetal Heart Rate (bpm): 140 Fundal Height: 30 cm Movement: Present     General:  Alert, oriented and cooperative. Patient is in no acute distress.  Skin: Skin is warm and dry. No rash noted.   Cardiovascular: Normal heart rate noted  Respiratory: Normal respiratory effort, no problems with respiration noted  Abdomen: Soft, gravid, appropriate for gestational age. Pain/Pressure: Absent     Pelvic: Vag. Bleeding: None     Cervical exam deferred        Extremities: Normal range of motion.  Edema: None  Mental Status: Normal mood and affect. Normal behavior. Normal judgment and thought content.   Urinalysis:      Assessment and Plan:  Pregnancy: G1P0000 at [redacted]w[redacted]d  1. Vaginal discharge Self swab today - Cervicovaginal ancillary only  2. Encounter for supervision of low-risk pregnancy in third trimester BP and FHR normal FH well below dates at 30 cm, ordered for growth US   Preterm labor symptoms and general obstetric precautions including but not limited to vaginal bleeding, contractions, leaking of fluid and fetal movement were reviewed in  detail with the patient. Please refer to After Visit Summary for other counseling recommendations.  Return in 2 weeks (on 09/23/2023) for Dyad patient, ob visit.   Teena Feast, MD

## 2023-09-09 NOTE — Patient Instructions (Signed)

## 2023-09-09 NOTE — Telephone Encounter (Signed)
 Attempted to call patient, no response.  Will send mychart message, but in brief I saw her earlier today for routine OB visit and her fundal height was 4 cm behind her dates, raising concern for IUGR. An ultrasound was scheduled for her for tomorrow, and patient later called the clinic and said she felt it was unnecessary and had cancelled the visit.   Will send her a mychart message explaining concern for FGR and concomitant increased risk of stillbirth in that scenario.

## 2023-09-09 NOTE — Telephone Encounter (Signed)
 Patient called in requesting for her u/s appt with MFM to be canceled. Patient states those appointment are a waste of time and money for her.

## 2023-09-10 ENCOUNTER — Telehealth: Payer: Self-pay | Admitting: Family Medicine

## 2023-09-10 ENCOUNTER — Ambulatory Visit

## 2023-09-10 ENCOUNTER — Other Ambulatory Visit: Payer: Self-pay

## 2023-09-10 ENCOUNTER — Other Ambulatory Visit (HOSPITAL_COMMUNITY): Payer: Self-pay

## 2023-09-10 ENCOUNTER — Ambulatory Visit: Payer: Self-pay | Admitting: Family Medicine

## 2023-09-10 LAB — CERVICOVAGINAL ANCILLARY ONLY
Bacterial Vaginitis (gardnerella): NEGATIVE
Candida Glabrata: NEGATIVE
Candida Vaginitis: POSITIVE — AB
Comment: NEGATIVE
Comment: NEGATIVE
Comment: NEGATIVE

## 2023-09-10 MED ORDER — FLUCONAZOLE 150 MG PO TABS
150.0000 mg | ORAL_TABLET | Freq: Once | ORAL | 0 refills | Status: DC
  Filled 2023-09-10 – 2023-09-11 (×3): qty 1, 1d supply, fill #0

## 2023-09-10 NOTE — Telephone Encounter (Signed)
 Patient called in stating that she received some results saying that she had an infection and will like her medicine to be sent over to her pharmacy  [facility] [address], [address], [place] [address]

## 2023-09-10 NOTE — Telephone Encounter (Signed)
 Patient called in requesting medication for some results she received. Also started to talk about Dr. Ilona Malta message to her and how she do not need the ultrasound because Dr Ilona Malta was measuring her wrong. She continued to say she did not want him again as a provider. She stated that she knows what's going on and its the positive result she received today so we just have to send her the medication so she can get better.

## 2023-09-11 ENCOUNTER — Other Ambulatory Visit (HOSPITAL_COMMUNITY): Payer: Self-pay

## 2023-09-11 ENCOUNTER — Other Ambulatory Visit: Payer: Self-pay

## 2023-09-12 ENCOUNTER — Other Ambulatory Visit: Payer: Self-pay

## 2023-09-17 NOTE — Telephone Encounter (Signed)
 Per chart review provider sent patient re: wet prep and dx yeast and med sent to pharmacy, patient has read message on 09/11/23. Ashley Macias

## 2023-09-24 ENCOUNTER — Ambulatory Visit

## 2023-09-24 ENCOUNTER — Other Ambulatory Visit: Payer: Self-pay

## 2023-09-24 ENCOUNTER — Ambulatory Visit: Attending: Obstetrics and Gynecology | Admitting: Obstetrics and Gynecology

## 2023-09-24 ENCOUNTER — Other Ambulatory Visit (HOSPITAL_COMMUNITY)
Admission: RE | Admit: 2023-09-24 | Discharge: 2023-09-24 | Disposition: A | Discharge status: Home / Self Care | Source: Ambulatory Visit | Attending: Family Medicine | Admitting: Family Medicine

## 2023-09-24 ENCOUNTER — Ambulatory Visit: Admitting: Family Medicine

## 2023-09-24 VITALS — BP 111/64 | HR 74

## 2023-09-24 VITALS — BP 101/66 | HR 105 | Wt 120.4 lb

## 2023-09-24 DIAGNOSIS — Z862 Personal history of diseases of the blood and blood-forming organs and certain disorders involving the immune mechanism: Secondary | ICD-10-CM | POA: Diagnosis not present

## 2023-09-24 DIAGNOSIS — O99340 Other mental disorders complicating pregnancy, unspecified trimester: Secondary | ICD-10-CM | POA: Insufficient documentation

## 2023-09-24 DIAGNOSIS — O26843 Uterine size-date discrepancy, third trimester: Secondary | ICD-10-CM

## 2023-09-24 DIAGNOSIS — Z3493 Encounter for supervision of normal pregnancy, unspecified, third trimester: Secondary | ICD-10-CM | POA: Insufficient documentation

## 2023-09-24 DIAGNOSIS — Z3A36 36 weeks gestation of pregnancy: Secondary | ICD-10-CM

## 2023-09-24 DIAGNOSIS — F418 Other specified anxiety disorders: Secondary | ICD-10-CM | POA: Diagnosis not present

## 2023-09-24 DIAGNOSIS — F419 Anxiety disorder, unspecified: Secondary | ICD-10-CM | POA: Insufficient documentation

## 2023-09-24 DIAGNOSIS — O99113 Other diseases of the blood and blood-forming organs and certain disorders involving the immune mechanism complicating pregnancy, third trimester: Secondary | ICD-10-CM

## 2023-09-24 DIAGNOSIS — Z363 Encounter for antenatal screening for malformations: Secondary | ICD-10-CM | POA: Diagnosis not present

## 2023-09-24 DIAGNOSIS — D573 Sickle-cell trait: Secondary | ICD-10-CM

## 2023-09-24 DIAGNOSIS — O99343 Other mental disorders complicating pregnancy, third trimester: Secondary | ICD-10-CM

## 2023-09-24 DIAGNOSIS — F32A Depression, unspecified: Secondary | ICD-10-CM | POA: Diagnosis not present

## 2023-09-24 DIAGNOSIS — O35BXX0 Maternal care for other (suspected) fetal abnormality and damage, fetal cardiac anomalies, not applicable or unspecified: Secondary | ICD-10-CM | POA: Insufficient documentation

## 2023-09-24 NOTE — Progress Notes (Signed)
 After review, MFM consult with provider is not indicated for today  Cassandria Clever, MD 09/24/2023 12:41 PM  [facility]

## 2023-09-24 NOTE — Progress Notes (Signed)
   PRENATAL VISIT NOTE  Subjective:  Ashley Macias is a 23 y.o. G1P0000 at [redacted]w[redacted]d being seen today for ongoing prenatal care.  She is currently monitored for the following issues for this low-risk pregnancy and has Supervision of low-risk pregnancy; Sickle cell trait (HCC); and Significant discrepancy between uterine size and clinical dates, antepartum, third trimester on their problem list.  Patient reports no bleeding, no contractions, no cramping, and no leaking.  Contractions: Not present.  .  Movement: Present. Denies leaking of fluid.   The following portions of the patient's history were reviewed and updated as appropriate: allergies, current medications, past family history, past medical history, past social history, past surgical history and problem list.   Objective:    Vitals:   09/24/23 1028  BP: 101/66  Pulse: (!) 105  Weight: 120 lb 6.4 oz (54.6 kg)    Fetal Status:  Fetal Heart Rate (bpm): 132   Movement: Present    General: Alert, oriented and cooperative. Patient is in no acute distress.  Skin: Skin is warm and dry. No rash noted.   Cardiovascular: Normal heart rate noted  Respiratory: Normal respiratory effort, no problems with respiration noted  Abdomen: Soft, gravid, appropriate for gestational age.  Pain/Pressure: Absent     Pelvic: Cervical exam performed in the presence of a chaperone      closed/50%/-3  Extremities: Normal range of motion.  Edema: None  Mental Status: Normal mood and affect. Normal behavior. Normal judgment and thought content.   Assessment and Plan:  Pregnancy: G1P0000 at [redacted]w[redacted]d 1. Encounter for supervision of low-risk pregnancy in third trimester (Primary) BP appropriate today - Cervicovaginal ancillary only - Culture, beta strep (group b only) - US  MFM OB FOLLOW UP; Future  2. Significant discrepancy between uterine size and clinical dates, antepartum, third trimester Fundal height 32 cm.  Patient was previously recommended a growth  ultrasound but declined.  Agreeable today.  Was able to get growth ultrasound following this appointment. - US  MFM OB FOLLOW UP; Future  3. Sickle cell trait (HCC)  4. Anxiety and depression Doing well at this time  5. [redacted] weeks gestation of pregnancy   Preterm labor symptoms and general obstetric precautions including but not limited to vaginal bleeding, contractions, leaking of fluid and fetal movement were reviewed in detail with the patient. Please refer to After Visit Summary for other counseling recommendations.   No follow-ups on file.  Future Appointments  Date Time Provider Department Center  10/01/2023  9:35 AM Ferdie Housekeeper, MD [facility]  10/08/2023  9:35 AM Ferdie Housekeeper, MD [facility]  10/15/2023  9:35 AM Ferdie Housekeeper, MD [facility]  10/22/2023  9:55 AM [facility]2 [facility]  10/22/2023 10:35 AM Abner Ables, MD [facility]    Ferdie Housekeeper, MD

## 2023-09-25 LAB — CERVICOVAGINAL ANCILLARY ONLY
Chlamydia: NEGATIVE
Comment: NEGATIVE
Comment: NORMAL
Neisseria Gonorrhea: NEGATIVE

## 2023-09-28 LAB — CULTURE, BETA STREP (GROUP B ONLY): Strep Gp B Culture: NEGATIVE

## 2023-10-01 ENCOUNTER — Other Ambulatory Visit: Payer: Self-pay

## 2023-10-01 ENCOUNTER — Other Ambulatory Visit (HOSPITAL_COMMUNITY): Payer: Self-pay

## 2023-10-01 ENCOUNTER — Ambulatory Visit: Admitting: Family Medicine

## 2023-10-01 VITALS — BP 104/68 | HR 87 | Wt 119.0 lb

## 2023-10-01 DIAGNOSIS — F419 Anxiety disorder, unspecified: Secondary | ICD-10-CM

## 2023-10-01 DIAGNOSIS — O99113 Other diseases of the blood and blood-forming organs and certain disorders involving the immune mechanism complicating pregnancy, third trimester: Secondary | ICD-10-CM

## 2023-10-01 DIAGNOSIS — F32A Depression, unspecified: Secondary | ICD-10-CM

## 2023-10-01 DIAGNOSIS — D573 Sickle-cell trait: Secondary | ICD-10-CM

## 2023-10-01 DIAGNOSIS — Z3493 Encounter for supervision of normal pregnancy, unspecified, third trimester: Secondary | ICD-10-CM

## 2023-10-01 DIAGNOSIS — Z3A37 37 weeks gestation of pregnancy: Secondary | ICD-10-CM

## 2023-10-01 DIAGNOSIS — O26843 Uterine size-date discrepancy, third trimester: Secondary | ICD-10-CM

## 2023-10-01 DIAGNOSIS — O99343 Other mental disorders complicating pregnancy, third trimester: Secondary | ICD-10-CM

## 2023-10-01 MED ORDER — COMPLETENATE 29-1 MG PO CHEW
1.0000 | CHEWABLE_TABLET | Freq: Every day | ORAL | 11 refills | Status: DC
  Filled 2023-10-01 (×2): qty 30, 30d supply, fill #0

## 2023-10-01 NOTE — Progress Notes (Signed)
   PRENATAL VISIT NOTE  Subjective:  Ashley Macias is a 23 y.o. G1P0000 at [redacted]w[redacted]d being seen today for ongoing prenatal care.  She is currently monitored for the following issues for this low-risk pregnancy and has Supervision of low-risk pregnancy; Sickle cell trait (HCC); and Significant discrepancy between uterine size and clinical dates, antepartum, third trimester on their problem list.  Patient reports no bleeding, no contractions, no cramping, and no leaking.  Contractions: Not present. Vag. Bleeding: None.  Movement: Present. Denies leaking of fluid.   The following portions of the patient's history were reviewed and updated as appropriate: allergies, current medications, past family history, past medical history, past social history, past surgical history and problem list.   Objective:    Vitals:   10/01/23 0955  BP: 104/68  Pulse: 87  Weight: 119 lb (54 kg)    Fetal Status:  Fetal Heart Rate (bpm): 138   Movement: Present    General: Alert, oriented and cooperative. Patient is in no acute distress.  Skin: Skin is warm and dry. No rash noted.   Cardiovascular: Normal heart rate noted  Respiratory: Normal respiratory effort, no problems with respiration noted  Abdomen: Soft, gravid, appropriate for gestational age.  Pain/Pressure: Absent     Pelvic: Cervical exam deferred        Extremities: Normal range of motion.  Edema: None  Mental Status: Normal mood and affect. Normal behavior. Normal judgment and thought content.   Assessment and Plan:  Pregnancy: G1P0000 at [redacted]w[redacted]d 1. Encounter for supervision of low-risk pregnancy in third trimester (Primary) FHR and BP appropriate today Continue routine prenatal care  2. Significant discrepancy between uterine size and clinical dates, antepartum, third trimester Had an ultrasound at last visit showing appropriate growth Fundal height at 34 cm today  3. Sickle cell trait (HCC)  4. Anxiety and depression Currently doing  well  5. [redacted] weeks gestation of pregnancy   Term labor symptoms and general obstetric precautions including but not limited to vaginal bleeding, contractions, leaking of fluid and fetal movement were reviewed in detail with the patient. Please refer to After Visit Summary for other counseling recommendations.   No follow-ups on file.  Future Appointments  Date Time Provider Department Center  10/08/2023  9:35 AM Ferdie Housekeeper, MD [facility]  10/15/2023  9:35 AM Ferdie Housekeeper, MD [facility]  10/20/2023  1:15 PM [facility] US2 [facility]  10/20/2023  1:55 PM Terrell Ostrand, Mardee Shackle, MD [facility]    Ferdie Housekeeper, MD

## 2023-10-08 ENCOUNTER — Ambulatory Visit: Admitting: Family Medicine

## 2023-10-08 ENCOUNTER — Other Ambulatory Visit: Payer: Self-pay

## 2023-10-08 VITALS — BP 110/73 | HR 88 | Wt 120.4 lb

## 2023-10-08 DIAGNOSIS — Z3493 Encounter for supervision of normal pregnancy, unspecified, third trimester: Secondary | ICD-10-CM

## 2023-10-08 DIAGNOSIS — O26843 Uterine size-date discrepancy, third trimester: Secondary | ICD-10-CM

## 2023-10-08 DIAGNOSIS — Z3A38 38 weeks gestation of pregnancy: Secondary | ICD-10-CM

## 2023-10-08 DIAGNOSIS — D573 Sickle-cell trait: Secondary | ICD-10-CM

## 2023-10-08 DIAGNOSIS — F32A Depression, unspecified: Secondary | ICD-10-CM

## 2023-10-08 DIAGNOSIS — F419 Anxiety disorder, unspecified: Secondary | ICD-10-CM

## 2023-10-08 DIAGNOSIS — O99343 Other mental disorders complicating pregnancy, third trimester: Secondary | ICD-10-CM

## 2023-10-08 DIAGNOSIS — O99113 Other diseases of the blood and blood-forming organs and certain disorders involving the immune mechanism complicating pregnancy, third trimester: Secondary | ICD-10-CM

## 2023-10-08 NOTE — Progress Notes (Signed)
 Subjective:  Ashley Macias is a 23 y.o. G1P0000 at [redacted]w[redacted]d being seen today for prenatal care.  Patient reports no bleeding, no contractions, no cramping, and no leaking.  Contractions: Not present.  Vag. Bleeding: None. Movement: Present. Denies leaking of fluid.   The following portions of the patient's history were reviewed and updated as appropriate: allergies, current medications, past family history, past medical history, past social history, past surgical history and problem list.   Objective:   Vitals:   10/08/23 1003  BP: 110/73  Pulse: 88  Weight: 120 lb 6 oz (54.6 kg)    Fetal Status: Fetal Heart Rate (bpm): 130   Movement: Present     General:  Alert, oriented and cooperative. Patient is in no acute distress.  Skin: Skin is warm and dry. No rash noted.   Cardiovascular: Normal heart rate noted  Respiratory: Normal respiratory effort, no problems with respiration noted  Abdomen: Soft, gravid, appropriate for gestational age. Pain/Pressure: Absent     Vaginal: Vag. Bleeding: None.       Cervix: Not evaluated        Extremities: Normal range of motion.  Edema: None  Mental Status: Normal mood and affect. Normal behavior. Normal judgment and thought content.   Urinalysis:     N/A  Assessment and Plan:  Pregnancy: G1P0000 at [redacted]w[redacted]d  1. Encounter for supervision of low-risk pregnancy in third trimester (Primary) No concerns at this time. Discussion of rupturing membranes at 29 weeks.   2. Significant discrepancy between uterine size and clinical dates, antepartum, third trimester US  with MFM 09/24/23 FW: 2718gm; 6lb 25% Continuing to monitor   3. Sickle cell trait (HCC)  4. Anxiety and depression Doing well, no concerns at this tine  5. [redacted] weeks gestation of pregnancy  Term labor symptoms and general obstetric precautions including but not limited to vaginal bleeding, contractions, leaking of fluid and fetal movement were reviewed in detail with the patient. Please  refer to After Visit Summary for other counseling recommendations.  No follow-ups on file.   Keyra Virella, Wynne Hedge, Student-PA

## 2023-10-09 DIAGNOSIS — Z419 Encounter for procedure for purposes other than remedying health state, unspecified: Secondary | ICD-10-CM | POA: Diagnosis not present

## 2023-10-10 ENCOUNTER — Other Ambulatory Visit: Payer: Self-pay

## 2023-10-10 ENCOUNTER — Inpatient Hospital Stay (HOSPITAL_COMMUNITY)
Admission: AD | Admit: 2023-10-10 | Discharge: 2023-10-12 | DRG: 807 | Disposition: A | Discharge status: Home / Self Care | Attending: Obstetrics and Gynecology | Admitting: Obstetrics and Gynecology

## 2023-10-10 ENCOUNTER — Inpatient Hospital Stay (HOSPITAL_COMMUNITY): Admitting: Anesthesiology

## 2023-10-10 ENCOUNTER — Encounter (HOSPITAL_COMMUNITY): Payer: Self-pay | Admitting: Obstetrics and Gynecology

## 2023-10-10 DIAGNOSIS — O9902 Anemia complicating childbirth: Secondary | ICD-10-CM | POA: Diagnosis not present

## 2023-10-10 DIAGNOSIS — Z23 Encounter for immunization: Secondary | ICD-10-CM | POA: Diagnosis not present

## 2023-10-10 DIAGNOSIS — O26893 Other specified pregnancy related conditions, third trimester: Secondary | ICD-10-CM | POA: Diagnosis not present

## 2023-10-10 DIAGNOSIS — Z555 Less than a high school diploma: Secondary | ICD-10-CM | POA: Diagnosis not present

## 2023-10-10 DIAGNOSIS — O99344 Other mental disorders complicating childbirth: Secondary | ICD-10-CM | POA: Diagnosis not present

## 2023-10-10 DIAGNOSIS — Z3A39 39 weeks gestation of pregnancy: Secondary | ICD-10-CM | POA: Diagnosis not present

## 2023-10-10 DIAGNOSIS — D573 Sickle-cell trait: Secondary | ICD-10-CM | POA: Diagnosis not present

## 2023-10-10 DIAGNOSIS — O4202 Full-term premature rupture of membranes, onset of labor within 24 hours of rupture: Secondary | ICD-10-CM | POA: Diagnosis not present

## 2023-10-10 LAB — CBC
HCT: 35.1 % — ABNORMAL LOW (ref 36.0–46.0)
Hemoglobin: 11.8 g/dL — ABNORMAL LOW (ref 12.0–15.0)
MCH: 30.7 pg (ref 26.0–34.0)
MCHC: 33.6 g/dL (ref 30.0–36.0)
MCV: 91.4 fL (ref 80.0–100.0)
Platelets: 265 10*3/uL (ref 150–400)
RBC: 3.84 MIL/uL — ABNORMAL LOW (ref 3.87–5.11)
RDW: 12.4 % (ref 11.5–15.5)
WBC: 8.5 10*3/uL (ref 4.0–10.5)
nRBC: 0 % (ref 0.0–0.2)

## 2023-10-10 LAB — TYPE AND SCREEN
ABO/RH(D): A POS
Antibody Screen: NEGATIVE

## 2023-10-10 MED ORDER — DIPHENHYDRAMINE HCL 25 MG PO CAPS: 25.0000 mg | ORAL_CAPSULE | Freq: Four times a day (QID) | ORAL | Status: DC | PRN

## 2023-10-10 MED ORDER — LIDOCAINE HCL (PF) 1 % IJ SOLN: 30.0000 mL | INTRAMUSCULAR | Status: DC | PRN

## 2023-10-10 MED ORDER — SENNOSIDES-DOCUSATE SODIUM 8.6-50 MG PO TABS
2.0000 | ORAL_TABLET | ORAL | Status: DC
  Administered 2023-10-12: 2 via ORAL
  Filled 2023-10-10 (×2): qty 2

## 2023-10-10 MED ORDER — ZOLPIDEM TARTRATE 5 MG PO TABS: 5.0000 mg | ORAL_TABLET | Freq: Every evening | ORAL | Status: DC | PRN

## 2023-10-10 MED ORDER — EPHEDRINE 5 MG/ML INJ: 10.0000 mg | INTRAVENOUS | Status: DC | PRN

## 2023-10-10 MED ORDER — COCONUT OIL OIL: 1.0000 | TOPICAL_OIL | Status: DC | PRN

## 2023-10-10 MED ORDER — DIPHENHYDRAMINE HCL 50 MG/ML IJ SOLN: 12.5000 mg | INTRAMUSCULAR | Status: DC | PRN

## 2023-10-10 MED ORDER — PHENYLEPHRINE 80 MCG/ML (10ML) SYRINGE FOR IV PUSH (FOR BLOOD PRESSURE SUPPORT): 80.0000 ug | PREFILLED_SYRINGE | INTRAVENOUS | Status: DC | PRN

## 2023-10-10 MED ORDER — OXYCODONE-ACETAMINOPHEN 5-325 MG PO TABS: 1.0000 | ORAL_TABLET | ORAL | Status: DC | PRN

## 2023-10-10 MED ORDER — OXYCODONE HCL 5 MG PO TABS: 5.0000 mg | ORAL_TABLET | ORAL | Status: DC | PRN

## 2023-10-10 MED ORDER — ONDANSETRON HCL 4 MG PO TABS: 4.0000 mg | ORAL_TABLET | ORAL | Status: DC | PRN

## 2023-10-10 MED ORDER — SIMETHICONE 80 MG PO CHEW: 80.0000 mg | CHEWABLE_TABLET | ORAL | Status: DC | PRN

## 2023-10-10 MED ORDER — TETANUS-DIPHTH-ACELL PERTUSSIS 5-2.5-18.5 LF-MCG/0.5 IM SUSY
0.5000 mL | PREFILLED_SYRINGE | Freq: Once | INTRAMUSCULAR | Status: AC
  Filled 2023-10-10: qty 0.5

## 2023-10-10 MED ORDER — OXYTOCIN BOLUS FROM INFUSION
333.0000 mL | Freq: Once | INTRAVENOUS | Status: AC
  Administered 2023-10-10: 333 mL via INTRAVENOUS

## 2023-10-10 MED ORDER — OXYTOCIN-SODIUM CHLORIDE 30-0.9 UT/500ML-% IV SOLN
2.5000 [IU]/h | INTRAVENOUS | Status: DC
  Filled 2023-10-10: qty 500

## 2023-10-10 MED ORDER — WITCH HAZEL-GLYCERIN EX PADS: 1.0000 | MEDICATED_PAD | CUTANEOUS | Status: DC | PRN

## 2023-10-10 MED ORDER — BENZOCAINE-MENTHOL 20-0.5 % EX AERO: 1.0000 | INHALATION_SPRAY | CUTANEOUS | Status: DC | PRN

## 2023-10-10 MED ORDER — MEDROXYPROGESTERONE ACETATE 150 MG/ML IM SUSP
150.0000 mg | INTRAMUSCULAR | Status: AC | PRN
  Administered 2023-10-12: 150 mg via INTRAMUSCULAR
  Filled 2023-10-10: qty 1

## 2023-10-10 MED ORDER — DIBUCAINE (PERIANAL) 1 % EX OINT: 1.0000 | TOPICAL_OINTMENT | CUTANEOUS | Status: DC | PRN

## 2023-10-10 MED ORDER — ACETAMINOPHEN 325 MG PO TABS: 650.0000 mg | ORAL_TABLET | ORAL | Status: DC | PRN

## 2023-10-10 MED ORDER — SOD CITRATE-CITRIC ACID 500-334 MG/5ML PO SOLN: 30.0000 mL | ORAL | Status: DC | PRN

## 2023-10-10 MED ORDER — FENTANYL CITRATE (PF) 100 MCG/2ML IJ SOLN
100.0000 ug | Freq: Once | INTRAMUSCULAR | Status: DC
  Filled 2023-10-10: qty 2

## 2023-10-10 MED ORDER — SODIUM CHLORIDE 0.9 % IV SOLN
12.5000 mg | Freq: Once | INTRAVENOUS | Status: DC
  Filled 2023-10-10: qty 0.5

## 2023-10-10 MED ORDER — OXYCODONE-ACETAMINOPHEN 5-325 MG PO TABS: 2.0000 | ORAL_TABLET | ORAL | Status: DC | PRN

## 2023-10-10 MED ORDER — LACTATED RINGERS IV SOLN
500.0000 mL | INTRAVENOUS | Status: DC | PRN
  Administered 2023-10-10: 1000 mL via INTRAVENOUS

## 2023-10-10 MED ORDER — FENTANYL-BUPIVACAINE-NACL 0.5-0.125-0.9 MG/250ML-% EP SOLN
12.0000 mL/h | EPIDURAL | Status: DC | PRN
  Administered 2023-10-10: 12 mL/h via EPIDURAL
  Filled 2023-10-10: qty 250

## 2023-10-10 MED ORDER — ONDANSETRON HCL 4 MG/2ML IJ SOLN: 4.0000 mg | Freq: Four times a day (QID) | INTRAMUSCULAR | Status: DC | PRN

## 2023-10-10 MED ORDER — LACTATED RINGERS IV SOLN: INTRAVENOUS | Status: DC

## 2023-10-10 MED ORDER — ONDANSETRON HCL 4 MG/2ML IJ SOLN: 4.0000 mg | INTRAMUSCULAR | Status: DC | PRN

## 2023-10-10 MED ORDER — LIDOCAINE HCL (PF) 1 % IJ SOLN
INTRAMUSCULAR | Status: DC | PRN
Start: 2023-10-10 — End: 2023-10-10
  Administered 2023-10-10: 11 mL via EPIDURAL

## 2023-10-10 MED ORDER — IBUPROFEN 600 MG PO TABS
600.0000 mg | ORAL_TABLET | Freq: Four times a day (QID) | ORAL | Status: DC
  Administered 2023-10-12 (×2): 600 mg via ORAL
  Filled 2023-10-10 (×7): qty 1

## 2023-10-10 MED ORDER — MEASLES, MUMPS & RUBELLA VAC IJ SOLR: 0.5000 mL | Freq: Once | INTRAMUSCULAR | Status: DC

## 2023-10-10 MED ORDER — LACTATED RINGERS IV SOLN: 500.0000 mL | Freq: Once | INTRAVENOUS | Status: DC

## 2023-10-10 MED ORDER — PRENATAL MULTIVITAMIN CH
1.0000 | ORAL_TABLET | Freq: Every day | ORAL | Status: DC
  Administered 2023-10-12: 1 via ORAL
  Filled 2023-10-10 (×2): qty 1

## 2023-10-10 NOTE — H&P (Cosign Needed Addendum)
 OBSTETRIC ADMISSION HISTORY AND PHYSICAL  Ashley Macias is a 23 y.o. female G1P0000 with IUP at [redacted]w[redacted]d by early US  presenting for painful contractions q3 minutes starting at midnight. She reports +FMs. No LOF, VB, blurry vision, headaches or peripheral edema.  She plans on formula feeding. She requests Depo for birth control. She received her prenatal care at [facility]   Dating: By US  @ [redacted]w[redacted]d --->  Estimated Date of Delivery: 10/17/23  Sono:   @[redacted]w[redacted]d , CWD, normal anatomy, cephalic presentation,  2718 g, 25% EFW   Prenatal History/Complications:  - Sickle cell trait - Discrepancy between uterine size and clinical dates in 3rd trimester: Fetal growth appropriate for gestational age per MFM on 09/24/23 US  - Anxiety/Depression: stable during pregnancy, meets with behavioral health    Past Medical History: Past Medical History:  Diagnosis Date   Anxiety    Phreesia 12/27/2019   Depression     Past Surgical History: Past Surgical History:  Procedure Laterality Date   NO PAST SURGERIES      Obstetrical History: OB History     Gravida  1   Para  0   Term  0   Preterm  0   AB  0   Living  0      SAB  0   IAB  0   Ectopic  0   Multiple  0   Live Births  0        Obstetric Comments  Current 12th grade         Social History Social History   Socioeconomic History   Marital status: Single    Spouse name: Not on file   Number of children: Not on file   Years of education: Not on file   Highest education level: 11th grade  Occupational History   Occupation: Consulting civil engineer  Tobacco Use   Smoking status: Some Days    Types: Cigars   Smokeless tobacco: Never   Tobacco comments:    Black and milds   Vaping Use   Vaping status: Never Used  Substance and Sexual Activity   Alcohol use: Not Currently    Comment: occasional   Drug use: Not Currently   Sexual activity: Yes    Birth control/protection: Injection  Other Topics Concern   Not on file   Social History Narrative   Not on file   Social Drivers of Health   Financial Resource Strain: Not on file  Food Insecurity: No Food Insecurity (03/24/2023)   Hunger Vital Sign    Worried About Running Out of Food in the Last Year: Never true    Ran Out of Food in the Last Year: Never true  Transportation Needs: No Transportation Needs (03/24/2023)   PRAPARE - Administrator, Civil Service (Medical): No    Lack of Transportation (Non-Medical): No  Physical Activity: Inactive (12/18/2018)   Exercise Vital Sign    Days of Exercise per Week: 0 days    Minutes of Exercise per Session: 0 min  Stress: No Stress Concern Present (12/18/2018)   [employer] of Occupational Health - Occupational Stress Questionnaire    Feeling of Stress : Not at all  Social Connections: Moderately Isolated (12/18/2018)   Social Connection and Isolation Panel    Frequency of Communication with Friends and Family: More than three times a week    Frequency of Social Gatherings with Friends and Family: More than three times a week    Attends  Religious Services: Never    Active Member of Clubs or Organizations: Not on file    Attends Club or Organization Meetings: 1 to 4 times per year    Marital Status: Never married    Family History: Family History  Problem Relation Age of Onset   Epilepsy Mother     Allergies: No Known Allergies  Pt denies allergies to latex, iodine, or shellfish.  Medications Prior to Admission  Medication Sig Dispense Refill Last Dose/Taking   aspirin  EC 81 MG tablet Take 1 tablet (81 mg total) by mouth daily. Swallow whole. (Patient not taking: Reported on 08/27/2023) 30 tablet 12    docusate sodium  (COLACE) 100 MG capsule Take 1 capsule (100 mg total) by mouth 2 (two) times daily. (Patient not taking: Reported on 10/08/2023) 10 capsule 0    Prenatal 27-1 MG TABS Take 1 tablet by mouth daily. (Patient not taking: Reported on 10/08/2023) 30 tablet 0    Prenatal  Vit-Fe Fumarate-FA (PREPLUS) 27-1 MG TABS Take 1 tablet by mouth daily. (Patient not taking: Reported on 07/25/2023) 30 tablet 11    prenatal vitamin w/FE, FA (NATACHEW) 29-1 MG CHEW chewable tablet Chew 1 tablet by mouth daily at 12 noon. (Patient not taking: Reported on 10/08/2023) 30 tablet 11      Review of Systems   Per HPI  Blood pressure 128/72, pulse 64, temperature 98.6 F (37 C), resp. rate 12, weight 54 kg, SpO2 100%. General appearance: alert and cooperative Lungs: breathing comfortably on RA, no respiratory distress Heart: regular rate on monitor,  Abdomen: gravid Extremities: BLE without erythema, edema, or tenderness with normal distal pulses bilaterally.  Presentation: cephalic Fetal monitoringBaseline: 135 bpm, Variability: Good {> 6 bpm), Accelerations: Reactive, and Decelerations: Absent Uterine activity: contractions variable  Dilation: 3 Effacement (%): 100 Station: -2 Exam by:: N. Adele Admire, RN   Prenatal labs: ABO, Rh: --/--/PENDING (06/13 1632) Antibody: PENDING (06/13 1632) Rubella: 15.40 (11/25 1108) RPR: Non Reactive (03/28 0852)  HBsAg: Negative (11/25 1108)  HIV: Non Reactive (03/28 0852)  GBS: Negative/-- (05/28 1327)  1 hr Glucola: Normal  Genetic screening:  Sickle cell trait, Low risk NIPS, declined AFP Anatomy US : Female, normal   Prenatal Transfer Tool  Maternal Diabetes: No Genetic Screening: Sickle cell trait - partner not tested/not involved Maternal Ultrasounds/Referrals: Discrepancy between uterine size and clinical dates in 3rd trimester: Fetal growth appropriate for gestational age per MFM on 09/24/23 US  Fetal Ultrasounds or other Referrals:  Other: was referred to MFM, MFM determined consult not needed   Maternal Substance Abuse:  No Significant Maternal Medications:  None Significant Maternal Lab Results: Group B Strep negative  Results for orders placed or performed during the hospital encounter of 10/10/23 (from the past 24  hours)  Type and screen [facility]   Collection Time: 10/10/23  4:32 PM  Result Value Ref Range   ABO/RH(D) PENDING    Antibody Screen PENDING    Sample Expiration      10/13/2023,2359 Performed at [facility], [address], [place], [place] [address]     Patient Active Problem List   Diagnosis Date Noted   Normal labor and delivery 10/10/2023   Significant discrepancy between uterine size and clinical dates, antepartum, third trimester 09/09/2023   Sickle cell trait (HCC) 05/30/2023   Supervision of low-risk pregnancy 03/17/2023    Assessment/Plan:  Ashley Macias is a 23 y.o. G1P0000 at [redacted]w[redacted]d here in latent labor of term pregnancy, may proceed with  augmentation as indicated.   #Labor: Arrived in latent labor. CTM, may proceed with augmentation as indicated.  #Pain: Epidural placed #FWB: Cat 1 #ID:  GBS negative  #MOF: Formula #MOC: Desires Depo  #Circ:  N/a  Carey Chapman, MD  Center for [facility], [facility] 10/10/2023, 4:57 PM  I was present for the exam and agree with above.  Felipe Horton Westin Knotts , CNM 10/10/2023 6:19 PM

## 2023-10-10 NOTE — Progress Notes (Signed)
 Patient ID: Ashley Macias, female   DOB: 06/13/2000, 23 y.o.   MRN: [id number]  In to meet pt and family; noted to be completely dilated recently; not feeling pressure/urge to push  VSS, afebrile FHR 120s, +accels, no decels, Cat 1 Ctx q 4 mins Cx C/C/vtx +2  IUP@39 .0wks End 1st stage  Began pushing with ctx- had SROM for clear fluid with first push Anticipate vag delivery  Jolayne Natter [facility] 10/10/2023 8:46 PM

## 2023-10-10 NOTE — MAU Note (Signed)
.  Ashley Macias is a 23 y.o. at [redacted]w[redacted]d here in MAU reporting: Patient reports ctx's that started last night at midnight 3-5 minutes apart Denies LOF, denies vaginal bleeding, reports decreased fetal movement   Onset of complaint:  Pain score: 9/10 There were no vitals filed for this visit.   ZOX:WRUEA in triage  Lab orders placed from triage:   none

## 2023-10-10 NOTE — MAU Provider Note (Signed)
 S: Ms. Ashley Macias is a 23 y.o. G1P0000 at [redacted]w[redacted]d  who presents to MAU today complaining contractions q 3 minutes since midnight. She denies vaginal bleeding. She denies LOF. She reports normal fetal movement.    O: BP 128/72   Pulse 64   Temp 98.6 F (37 C)   Resp 12   Wt 54 kg   LMP  (LMP Unknown)   SpO2 100%   BMI 21.78 kg/m  GENERAL: Well-developed, well-nourished female in no acute distress.  HEAD: Normocephalic, atraumatic.  CHEST: Normal effort of breathing, regular heart rate ABDOMEN: Soft, nontender, gravid  Cervical exam:  Dilation: 3 Effacement (%): 100 Station: -2 Presentation: Vertex Exam by:: N. Adele Admire, RN   I was asked by the labor team to address patient's pain status Patient is throwing up in the trash can, and rating her pain 10/10 requesting IV pain medication  Fetal tracing review with 15x15 accels, however repeated variables.  MAU requests admission from the labor team.   A: SIUP at [redacted]w[redacted]d  Active labor  P:  Labor admit orders placed GBS negative. Fentanyl and phenergan  ordered.     Almond Jaffe I, NP 10/10/2023 4:20 PM

## 2023-10-10 NOTE — Discharge Summary (Signed)
 Postpartum Discharge Summary  Date of Service updated***     Patient Name: Ashley Macias DOB: 2001-04-15 MRN: [id number]  Date of admission: 10/10/2023 Delivery date:10/10/2023 Delivering provider: Hermann Look D Date of discharge: 10/10/2023  Admitting diagnosis: Normal labor and delivery [O80] Intrauterine pregnancy: [redacted]w[redacted]d     Secondary diagnosis:  Principal Problem:   Normal labor and delivery Active Problems:   Sickle cell trait (HCC)  Additional problems: none    Discharge diagnosis: Term Pregnancy Delivered                                              Post partum procedures:none Augmentation: none Complications: None  Hospital course: Onset of Labor With Vaginal Delivery      23 y.o. yo G1P1001 at [redacted]w[redacted]d was admitted in Latent Labor on 10/10/2023. Labor course was uncomplicated. Membrane Rupture Time/Date: 8:42 PM,10/10/2023  Delivery Method:Vaginal, Spontaneous Operative Delivery:N/A Episiotomy: None Lacerations:  None Patient had a postpartum course complicated by ***.  She is ambulating, tolerating a regular diet, passing flatus, and urinating well. Patient is discharged home in stable condition on 10/10/23.  Newborn Data: Birth date:10/10/2023 Birth time:8:59 PM Gender:Female Living status:Living Apgars:7 ,9  Weight:2930 g (6lb 7.4oz)  Magnesium Sulfate received: No BMZ received: No Rhophylac:N/A MMR:N/A T-DaP:declined prenatally Flu: Yes RSV Vaccine received: No Transfusion:No  Immunizations received: Immunization History  Administered Date(s) Administered   DTaP 12/01/2002, 01/20/2003, 03/10/2003, 12/22/2003, 09/14/2007   DTaP / IPV 09/14/2007   Dtap, Unspecified 12/01/2002, 01/20/2003, 03/10/2003, 12/22/2003   HIB (PRP-OMP) 12/01/2002, 01/20/2003, 03/10/2003, 12/22/2003   HIB, Unspecified 12/01/2002, 01/20/2003, 03/10/2003, 12/22/2003   HPV 9-valent 01/17/2015, 12/24/2018   HPV Quadrivalent 12/24/2018   Hep B, Unspecified 11-06-02,  12/01/2002, 08/23/2003   Hepatitis A 09/14/2007, 08/01/2009   Hepatitis A, Ped/Adol-2 Dose 09/14/2007, 08/01/2009   Hepatitis B Apr 15, 2003, 12/01/2002, 08/23/2003   Hpv-Unspecified 01/17/2015   IPV 12/01/2002, 01/20/2003, 08/23/2003, 09/14/2007   Influenza Nasal 01/22/2010   Influenza, Seasonal, Injecte, Preservative Fre 03/24/2023   Influenza,inj,Quad PF,6+ Mos 01/17/2015, 12/24/2018   Influenza-Unspecified 04/05/2004, 12/24/2018   MMR 08/23/2003, 09/14/2007   MenQuadfi_Meningococcal Groups ACYW Conjugate 01/15/2020   Meningococcal Conjugate 01/17/2015   Meningococcal Mcv4o 01/17/2015   PFIZER(Purple Top)SARS-COV-2 Vaccination 01/14/2020   Pneumococcal Conjugate PCV 7 12/01/2002, 01/20/2003, 03/10/2003, 08/23/2003   Pneumococcal Conjugate-13 12/01/2002, 01/20/2003, 03/10/2003, 08/23/2003   Polio, Unspecified 12/01/2002, 01/20/2003, 08/23/2003   Tdap 01/17/2015   Varicella 08/23/2003, 09/14/2007    Physical exam  Vitals:   10/10/23 2140 10/10/23 2205 10/10/23 2215 10/10/23 2314  BP: (!) 127/93 130/72 (!) 138/92 117/73  Pulse:  84 93 65  Resp:    16  Temp:    98.2 F (36.8 C)  TempSrc:    Oral  SpO2:    100%  Weight:      Height:       General: {Exam; general:21111117} Lochia: {Desc; appropriate/inappropriate:30686::appropriate} Uterine Fundus: {Desc; firm/soft:30687} Incision: {Exam; incision:21111123} DVT Evaluation: {Exam; dvt:2111122} Labs: Lab Results  Component Value Date   WBC 8.5 10/10/2023   HGB 11.8 (L) 10/10/2023   HCT 35.1 (L) 10/10/2023   MCV 91.4 10/10/2023   PLT 265 10/10/2023      Latest Ref Rng & Units 05/01/2022    3:13 PM  CMP  Glucose 70 - 99 mg/dL 409   BUN 6 - 20 mg/dL 8   Creatinine 8.11 - 9.14  mg/dL 1.61   Sodium 096 - 045 mmol/L 138   Potassium 3.5 - 5.1 mmol/L 3.5   Chloride 98 - 111 mmol/L 105   CO2 22 - 32 mmol/L 22   Calcium 8.9 - 10.3 mg/dL 9.5   Total Protein 6.5 - 8.1 g/dL 7.6   Total Bilirubin 0.3 - 1.2 mg/dL 0.8    Alkaline Phos 38 - 126 U/L 60   AST 15 - 41 U/L 23   ALT 0 - 44 U/L 11    Edinburgh Score:     No data to display         No data recorded  After visit meds:  Allergies as of 10/10/2023   No Known Allergies   Med Rec must be completed prior to using this [facility]***        Discharge home in stable condition Infant Feeding: {Baby feeding:23562} Infant Disposition:{CHL IP OB HOME WITH WUJWJX:91478} Discharge instruction: per After Visit Summary and Postpartum booklet. Activity: Advance as tolerated. Pelvic rest for 6 weeks.  Diet: routine diet Future Appointments: Future Appointments  Date Time Provider Department Center  10/15/2023  9:35 AM Ferdie Housekeeper, MD [facility]  10/20/2023  1:15 PM [facility] US2 [facility]  10/20/2023  1:55 PM Cresenzo, Mardee Shackle, MD [facility]   Follow up Visit:  Jolayne Natter, CNM  P [facility] This patient is a Mom+Baby Combined care infant. The patient and baby need the following appointments scheduled:  Infant needs a newborn weight check -- 2-3 days from discharge. Approximate date of d/c: 6/15 Infant needs a 2 week weight check Infant needs 1 month Well Child Check  Thank you!  Please schedule this patient for Postpartum visit in: 6 weeks with the following provider: Any provider In-Person For C/S patients schedule nurse incision check in weeks 2 weeks: no Low risk pregnancy complicated by: none Delivery mode:  SVD Anticipated Birth Control:  Depo- may want in-hospital PP Procedures needed: routine Pap Edinburgh: not assessed yet Schedule Integrated BH visit: no No relevant baby issues   10/10/2023 Jolayne Natter, CNM

## 2023-10-10 NOTE — Plan of Care (Signed)

## 2023-10-10 NOTE — Progress Notes (Signed)
 Labor Progress Note LITZI BINNING is a 23 y.o. G1P0000 at [redacted]w[redacted]d presented for IOL for latent labor.   S: Patient is resting comfortably. Feeling better since epidural placement.   O:  BP 117/68   Pulse 72   Temp 98.2 F (36.8 C) (Oral)   Resp 16   Ht 5' 2 (1.575 m)   Wt 54 kg   LMP  (LMP Unknown)   SpO2 99%   BMI 21.77 kg/m  EFM: 120 bpm/good variability/accels present/no decels  CVE: Dilation: 5 Effacement (%): 100 Station: -1 Presentation: Vertex Exam by:: Adline Hook, RN   A&P: 23 y.o. G1P0000 [redacted]w[redacted]d admitted for IOL for latent labor.  #Labor: Progressing well. On cervical exam, head not fully applied. May consider AROM when head better applied.  #Pain: Epidural in place #FWB: Cat 1 #GBS negative  Carey Chapman, MD Center for [facility], [facility] 7:02 PM

## 2023-10-10 NOTE — Anesthesia Procedure Notes (Signed)
 Epidural Patient location during procedure: OB Start time: 10/10/2023 5:25 PM End time: 10/10/2023 5:39 PM  Staffing Anesthesiologist: Earvin Goldberg, MD Performed: anesthesiologist   Preanesthetic Checklist Completed: patient identified, IV checked, site marked, risks and benefits discussed, surgical consent, monitors and equipment checked, pre-op evaluation and timeout performed  Epidural Patient position: sitting Prep: ChloraPrep Patient monitoring: heart rate, cardiac monitor, continuous pulse ox and blood pressure Approach: midline Injection technique: LOR saline  Needle:  Needle type: Tuohy  Needle gauge: 17 G Needle length: 9 cm Needle insertion depth: 4 cm Catheter type: closed end flexible Catheter size: 20 Guage Catheter at skin depth: 8 cm Test dose: negative  Assessment Events: blood not aspirated, injection not painful, no injection resistance, no paresthesia and negative IV test  Additional Notes Reason for block:procedure for pain

## 2023-10-10 NOTE — Anesthesia Preprocedure Evaluation (Signed)
 Anesthesia Evaluation  Patient identified by MRN, date of birth, ID band Patient awake    Reviewed: Allergy & Precautions, H&P , NPO status , Patient's Chart, lab work & pertinent test results  Airway Mallampati: II  TM Distance: >3 FB Neck ROM: Full    Dental no notable dental hx.    Pulmonary neg pulmonary ROS, Current Smoker and Patient abstained from smoking.   Pulmonary exam normal breath sounds clear to auscultation       Cardiovascular negative cardio ROS Normal cardiovascular exam Rhythm:Regular Rate:Normal     Neuro/Psych   Anxiety Depression    negative neurological ROS  negative psych ROS   GI/Hepatic negative GI ROS, Neg liver ROS,,,  Endo/Other  negative endocrine ROS    Renal/GU negative Renal ROS  negative genitourinary   Musculoskeletal negative musculoskeletal ROS (+)    Abdominal   Peds negative pediatric ROS (+)  Hematology negative hematology ROS (+)   Anesthesia Other Findings   Reproductive/Obstetrics (+) Pregnancy                             Anesthesia Physical Anesthesia Plan  ASA: 2  Anesthesia Plan: Epidural   Post-op Pain Management:    Induction:   PONV Risk Score and Plan:   Airway Management Planned:   Additional Equipment:   Intra-op Plan:   Post-operative Plan:   Informed Consent:   Plan Discussed with:   Anesthesia Plan Comments:        Anesthesia Quick Evaluation

## 2023-10-11 LAB — RPR: RPR Ser Ql: NONREACTIVE

## 2023-10-11 NOTE — Anesthesia Postprocedure Evaluation (Signed)
 Anesthesia Post Note  Patient: Ashley Macias  Procedure(s) Performed: AN AD HOC LABOR EPIDURAL     Patient location during evaluation: Mother Baby Anesthesia Type: Epidural Level of consciousness: awake and alert Pain management: pain level controlled Vital Signs Assessment: post-procedure vital signs reviewed and stable Respiratory status: spontaneous breathing, nonlabored ventilation and respiratory function stable Cardiovascular status: stable Postop Assessment: no headache, no backache and epidural receding Anesthetic complications: no   No notable events documented.  Last Vitals:  Vitals:   10/11/23 0037 10/11/23 0501  BP: 128/72 112/63  Pulse: 80 69  Resp: 16 16  Temp: 36.7 C   SpO2: 100% 99%    Last Pain:  Vitals:   10/11/23 0500  TempSrc:   PainSc: 0-No pain   Pain Goal:                   Moe Brier

## 2023-10-11 NOTE — Lactation Note (Signed)
 This note was copied from a baby's chart. Lactation Consultation Note  Patient Name: Ashley Macias GNFAO'Z Date: 10/11/2023 Age:21 hours  Mom chooses to formula feed.   Maternal Data    Feeding    LATCH Score                    Lactation Tools Discussed/Used    Interventions    Discharge    Consult Status Consult Status: Complete    Ashley Macias G 10/11/2023, 3:59 AM

## 2023-10-11 NOTE — Progress Notes (Signed)
 CSW received consult for hx of Anxiety and Depression. CSW met with Ashley Macias to offer support and complete assessment. When CSW entered room, Ashley Macias was observing laying in hospital bed feeding infant. CSW introduced self and explained reason for visit. Ashley Macias presented as calm, was agreeable to CSW consult, and remained engaged during encounter.   CSW inquired how Ashley Macias is feeling emotionally since infant's arrival. Ashley Macias reports feeling great. CSW inquired about Ashley Macias's mental health history. Ashley Macias acknowledged a diagnosis of depression and anxiety a couple years ago. CSW inquired about mental health symptoms during pregnancy. Ashley Macias reports she feels she is able to emotionally cope with feelings of sadness and anxiety better than when she was initially diagnosed and feels she is in a better space mentally. Ashley Macias noted feelings of anger, rage, and irritability during pregnancy. CSW inquired about current treatment. Ashley Macias reports she is not current with a therapist and is not prescribed medication. Ashley Macias reports she took Prozac  in 2023 and had considered therapy in the past. Ashley Macias was agreeable to outpatient mental health resources, which CSW provided. Ashley Macias identified her family as her supports, notably her mom, dad, and grandmother. CSW assessed for safety. Ashley Macias denied SI/HI/domestic violence.  CSW provided education regarding the baby blues period vs. perinatal mood disorders, discussed treatment and gave resources for mental health follow up if concerns arise.  CSW recommends self-evaluation during the postpartum time period using the New Mom Checklist from Postpartum Progress and encouraged Ashley Macias to contact a medical professional if symptoms are noted at any time.    Ashley Macias reports she has all needed items for infant, including a car seat and bassinet. Ashley Macias has chosen the mom/baby dyad at [facility] as infant's pediatrician. Ashley Macias declined additional resource needs at this time.   CSW provided review of Sudden Infant  Death Syndrome (SIDS) precautions.    CSW identifies no further need for intervention and no barriers to discharge at this time.  Signed,  Elizabeth Gulling, MSW, LCSWA, LCASA 10/11/2023 4:03 PM

## 2023-10-11 NOTE — Progress Notes (Addendum)
 Post Partum Day #1 Subjective: no complaints, up ad lib, and tolerating PO; bottlefeeding going well; plans Depo for PP contraception  Objective: Blood pressure 112/63, pulse 69, temperature 98 F (36.7 C), temperature source Oral, resp. rate 16, height 5' 2 (1.575 m), weight 54 kg, SpO2 99%, unknown if currently breastfeeding.  Physical Exam:  General: cooperative, fatigued, and no distress Lochia: appropriate Uterine Fundus: firm DVT Evaluation: No evidence of DVT seen on physical exam.  Recent Labs    10/10/23 1632  HGB 11.8*  HCT 35.1*    Assessment/Plan: Plan for discharge tomorrow Depo ordered if she would like it prior to d/c   LOS: 1 day   Jolayne Natter, CNM 10/11/2023, 9:15 AM

## 2023-10-12 MED ORDER — BENZOCAINE-MENTHOL 20-0.5 % EX AERO
1.0000 | INHALATION_SPRAY | CUTANEOUS | 3 refills | Status: AC | PRN
  Filled 2023-10-12: qty 78, fill #0

## 2023-10-12 MED ORDER — IBUPROFEN 600 MG PO TABS
600.0000 mg | ORAL_TABLET | Freq: Four times a day (QID) | ORAL | 0 refills | Status: AC
Start: 2023-10-12 — End: ?
  Filled 2023-10-12 – 2023-10-13 (×2): qty 30, 8d supply, fill #0

## 2023-10-12 MED ORDER — ACETAMINOPHEN 325 MG PO TABS
650.0000 mg | ORAL_TABLET | ORAL | 0 refills | Status: AC | PRN
  Filled 2023-10-12: qty 50, 5d supply, fill #0

## 2023-10-12 MED ORDER — SENNOSIDES-DOCUSATE SODIUM 8.6-50 MG PO TABS
2.0000 | ORAL_TABLET | ORAL | 0 refills | Status: AC
  Filled 2023-10-12: qty 60, 30d supply, fill #0

## 2023-10-13 ENCOUNTER — Other Ambulatory Visit: Payer: Self-pay

## 2023-10-15 ENCOUNTER — Encounter: Admitting: Family Medicine

## 2023-10-20 ENCOUNTER — Encounter: Admitting: Family Medicine

## 2023-10-20 ENCOUNTER — Telehealth (HOSPITAL_COMMUNITY): Payer: Self-pay | Admitting: *Deleted

## 2023-10-20 ENCOUNTER — Other Ambulatory Visit

## 2023-10-20 NOTE — Telephone Encounter (Signed)
 10/20/2023  Name: Ashley Macias MRN: [id number] DOB: [birth year]  Reason for Call:  Transition of Care Hospital Discharge Call  Contact Status: Patient Contact Status: Message  Language assistant needed:          Follow-Up Questions:    Van Postnatal Depression Scale:  In the Past 7 Days:    PHQ2-9 Depression Scale:     Discharge Follow-up:    Post-discharge interventions: NA  Mliss Sieve, RN 10/20/2023 13:49

## 2023-10-22 ENCOUNTER — Encounter: Admitting: Family Medicine

## 2023-10-22 ENCOUNTER — Other Ambulatory Visit

## 2023-11-08 DIAGNOSIS — Z419 Encounter for procedure for purposes other than remedying health state, unspecified: Secondary | ICD-10-CM | POA: Diagnosis not present

## 2023-11-28 ENCOUNTER — Other Ambulatory Visit: Payer: Self-pay

## 2023-11-28 ENCOUNTER — Other Ambulatory Visit (HOSPITAL_COMMUNITY)
Admission: RE | Admit: 2023-11-28 | Discharge: 2023-11-28 | Disposition: A | Discharge status: Home / Self Care | Source: Ambulatory Visit | Attending: Family Medicine | Admitting: Family Medicine

## 2023-11-28 ENCOUNTER — Encounter: Payer: Self-pay | Admitting: Family Medicine

## 2023-11-28 ENCOUNTER — Ambulatory Visit: Admitting: Family Medicine

## 2023-11-28 ENCOUNTER — Other Ambulatory Visit (HOSPITAL_COMMUNITY): Payer: Self-pay

## 2023-11-28 DIAGNOSIS — Z113 Encounter for screening for infections with a predominantly sexual mode of transmission: Secondary | ICD-10-CM | POA: Insufficient documentation

## 2023-11-28 DIAGNOSIS — Z124 Encounter for screening for malignant neoplasm of cervix: Secondary | ICD-10-CM | POA: Diagnosis not present

## 2023-11-28 DIAGNOSIS — N938 Other specified abnormal uterine and vaginal bleeding: Secondary | ICD-10-CM | POA: Insufficient documentation

## 2023-11-28 DIAGNOSIS — M545 Low back pain, unspecified: Secondary | ICD-10-CM

## 2023-11-28 MED ORDER — CYCLOBENZAPRINE HCL 5 MG PO TABS
5.0000 mg | ORAL_TABLET | Freq: Three times a day (TID) | ORAL | 0 refills | Status: AC | PRN
  Filled 2023-11-28 (×2): qty 30, 10d supply, fill #0

## 2023-11-28 NOTE — Progress Notes (Signed)
 US  scheduled for 8/7 at 1:00 at the [facility] location.

## 2023-11-28 NOTE — Progress Notes (Signed)
 Post Partum Visit Note  Ashley Macias is a 23 y.o. [id number] female who presents for a postpartum visit. She is 7 weeks postpartum following a normal spontaneous vaginal delivery.  I have fully reviewed the prenatal and intrapartum course. The delivery was at 39 gestational weeks.  Anesthesia: epidural. Postpartum course has been notable for ongoing vaginal bleeding, stopped for a bit then started back up, no clots. Baby is doing well. Baby is feeding by bottle - Similac Advance. Bleeding moderate lochia. Bowel function is normal. Bladder function is normal. Patient is sexually active. Contraception method is Depo-Provera  injections. Postpartum depression screening: negative.   The pregnancy intention screening data noted above was reviewed. Potential methods of contraception were discussed. The patient elected to proceed with No data recorded.   Edinburgh Postnatal Depression Scale - 11/28/23 1018       Edinburgh Postnatal Depression Scale:  In the Past 7 Days   I have been able to laugh and see the funny side of things. 0    I have looked forward with enjoyment to things. 0    I have blamed myself unnecessarily when things went wrong. 0    I have been anxious or worried for no good reason. 0    I have felt scared or panicky for no good reason. 0    Things have been getting on top of me. 0    I have been so unhappy that I have had difficulty sleeping. 0    I have felt sad or miserable. 0    I have been so unhappy that I have been crying. 0    The thought of harming myself has occurred to me. 0    Edinburgh Postnatal Depression Scale Total 0          Health Maintenance Due  Topic Date Due   Pneumococcal Vaccine: 19-49 Years (1 of 1 - PPSV23, PCV20, or PCV21) 08/21/2008   Meningococcal B Vaccine (1 of 2 - Standard) Never done   COVID-19 Vaccine (2 - 2024-25 season) 12/29/2022   Cervical Cancer Screening (Pap smear)  Never done   INFLUENZA VACCINE  11/28/2023    The following  portions of the patient's history were reviewed and updated as appropriate: allergies, current medications, past family history, past medical history, past social history, past surgical history, and problem list.  Review of Systems Pertinent items noted in HPI and remainder of comprehensive ROS otherwise negative.  Objective:  LMP  (LMP Unknown)    General:  alert, cooperative, and appears stated age   Breasts:  not indicated  Lungs: Comfortalbe on room air  Wound N/a  GU exam:  normal        Assessment:   Postpartum care and examination  Screening for cervical cancer - Plan: Cytology - PAP( [place] [facility])  Screening examination for sexually transmitted disease - Plan: Cytology - PAP( [facility])  DUB (dysfunctional uterine bleeding) - Plan: US  Pelvis Complete  Normal postpartum exam.   Plan:   Essential components of care per ACOG recommendations:  1.  Mood and well being: Patient with negative depression screening today. Reviewed local resources for support.  - Patient tobacco use? No.   - hx of drug use? No.    2. Infant care and feeding:  -Patient currently breastmilk feeding? No.  -Social determinants of health (SDOH) reviewed in EPIC. No concerns  3. Sexuality, contraception and birth spacing - Patient does not want a pregnancy in the next year.  Desired family size is uncertain.  - Reviewed reproductive life planning. Reviewed contraceptive methods based on pt preferences and effectiveness.  Patient desired Depo-Provera .   - Discussed birth spacing of 18 months  4. Sleep and fatigue -Encouraged family/partner/community support of 4 hrs of uninterrupted sleep to help with mood and fatigue  5. Physical Recovery  - Discussed patients delivery and complications. She describes her labor as good. - Patient had a Vaginal, no problems at delivery. Patient had no laceration. Perineal healing reviewed. Patient expressed understanding - Patient has urinary incontinence?  No. - Patient is safe to resume physical and sexual activity  6.  Health Maintenance - HM due items addressed Yes - Last pap smear No results found for: DIAGPAP Pap smear done at today's visit.  -Breast Cancer screening indicated? No.   7. Chronic Disease/Pregnancy Condition follow up: None 1. Postpartum care and examination   2. Screening for cervical cancer   3. Screening examination for sexually transmitted disease   4. DUB (dysfunctional uterine bleeding)    1. Postpartum care and examination See above  2. Screening for cervical cancer Pap collected today  3. Screening examination for sexually transmitted disease Sti screen collected with pap  4. DUB (dysfunctional uterine bleeding) Ongoing bleeding almost 7 weeks PP, US  ordered to rule out retained POCs  5. Acute back pain Discussed not related to epidural Trial flexeril    - PCP follow up   Donnice CHRISTELLA Carolus, MD/MPH Attending Family Medicine Physician, [facility] for [facility], [facility]

## 2023-12-04 ENCOUNTER — Ambulatory Visit (HOSPITAL_BASED_OUTPATIENT_CLINIC_OR_DEPARTMENT_OTHER): Admission: RE | Admit: 2023-12-04 | Source: Ambulatory Visit

## 2023-12-06 ENCOUNTER — Ambulatory Visit (HOSPITAL_BASED_OUTPATIENT_CLINIC_OR_DEPARTMENT_OTHER)

## 2023-12-09 ENCOUNTER — Ambulatory Visit: Payer: Self-pay | Admitting: Family Medicine

## 2023-12-09 DIAGNOSIS — Z419 Encounter for procedure for purposes other than remedying health state, unspecified: Secondary | ICD-10-CM | POA: Diagnosis not present

## 2023-12-10 LAB — CYTOLOGY - PAP
Chlamydia: NEGATIVE
Comment: NEGATIVE
Comment: NEGATIVE
Comment: NEGATIVE
Comment: NORMAL
Diagnosis: REACTIVE
High risk HPV: NEGATIVE
Neisseria Gonorrhea: NEGATIVE
Trichomonas: NEGATIVE

## 2023-12-30 ENCOUNTER — Ambulatory Visit

## 2024-01-02 ENCOUNTER — Ambulatory Visit

## 2024-01-06 ENCOUNTER — Other Ambulatory Visit: Payer: Self-pay

## 2024-01-06 ENCOUNTER — Ambulatory Visit (INDEPENDENT_AMBULATORY_CARE_PROVIDER_SITE_OTHER): Admitting: *Deleted

## 2024-01-06 VITALS — BP 124/83 | HR 99 | Wt 102.0 lb

## 2024-01-06 DIAGNOSIS — Z3042 Encounter for surveillance of injectable contraceptive: Secondary | ICD-10-CM

## 2024-01-06 MED ORDER — MEDROXYPROGESTERONE ACETATE 150 MG/ML IM SUSP
150.0000 mg | Freq: Once | INTRAMUSCULAR | Status: AC
  Administered 2024-01-06: 150 mg via INTRAMUSCULAR

## 2024-01-06 NOTE — Progress Notes (Signed)
 Ashley Macias Seeds here for Depo-Provera  Injection. Injection administered without complication. Patient will return in 3 months for next injection between 03/23/24 and 04/06/24. Next annual visit due 11/2024.   Rosina, RN

## 2024-01-09 DIAGNOSIS — Z419 Encounter for procedure for purposes other than remedying health state, unspecified: Secondary | ICD-10-CM | POA: Diagnosis not present

## 2024-02-08 DIAGNOSIS — Z419 Encounter for procedure for purposes other than remedying health state, unspecified: Secondary | ICD-10-CM | POA: Diagnosis not present

## 2024-03-23 ENCOUNTER — Ambulatory Visit (INDEPENDENT_AMBULATORY_CARE_PROVIDER_SITE_OTHER)

## 2024-03-23 VITALS — BP 102/57 | HR 92 | Ht 63.0 in | Wt 102.2 lb

## 2024-03-23 DIAGNOSIS — Z3042 Encounter for surveillance of injectable contraceptive: Secondary | ICD-10-CM

## 2024-03-23 MED ORDER — MEDROXYPROGESTERONE ACETATE 150 MG/ML IM SUSY
150.0000 mg | PREFILLED_SYRINGE | Freq: Once | INTRAMUSCULAR | Status: AC
  Administered 2024-03-23: 150 mg via INTRAMUSCULAR

## 2024-03-23 NOTE — Progress Notes (Signed)
 Ashley Macias Se[facility] here for Depo-Provera  Injection. Last injection 01/06/24. Today Injection administered without complication. Patient will return in 3 months for next injection between 06/08/24 and 06/22/24. Patient states she has no further questions or concerns.   Devon, RN 03/23/2024

## 2024-06-08 ENCOUNTER — Ambulatory Visit
